# Patient Record
Sex: Male | Born: 1965 | Race: White | Hispanic: No | Marital: Married | Smoking: Former smoker
Health system: Southern US, Community
[De-identification: ages and names within clinical notes are randomized; demographics above are authoritative.]

## PROBLEM LIST (undated history)

## (undated) DIAGNOSIS — F419 Anxiety disorder, unspecified: Secondary | ICD-10-CM

## (undated) DIAGNOSIS — N529 Male erectile dysfunction, unspecified: Secondary | ICD-10-CM

## (undated) HISTORY — DX: Anxiety disorder, unspecified: F41.9

## (undated) HISTORY — DX: Male erectile dysfunction, unspecified: N52.9

---

## 2008-12-02 ENCOUNTER — Encounter: Admission: RE | Admit: 2008-12-02 | Discharge: 2008-12-02 | Payer: Self-pay | Admitting: Family Medicine

## 2009-12-26 ENCOUNTER — Encounter: Admission: RE | Admit: 2009-12-26 | Discharge: 2009-12-26 | Payer: Self-pay | Admitting: Otolaryngology

## 2011-08-05 ENCOUNTER — Ambulatory Visit (INDEPENDENT_AMBULATORY_CARE_PROVIDER_SITE_OTHER): Payer: BC Managed Care – PPO

## 2011-08-05 DIAGNOSIS — J060 Acute laryngopharyngitis: Secondary | ICD-10-CM

## 2011-12-03 ENCOUNTER — Other Ambulatory Visit: Payer: Self-pay | Admitting: Internal Medicine

## 2011-12-04 NOTE — Telephone Encounter (Signed)
Pt states he called in to pharmacy and did refill request for viagra and pharmacy states they did request twice no response , he wants to know if he can get another refill then call and make an appt. [phone]

## 2012-05-20 ENCOUNTER — Other Ambulatory Visit: Payer: Self-pay | Admitting: Internal Medicine

## 2012-06-02 ENCOUNTER — Ambulatory Visit (INDEPENDENT_AMBULATORY_CARE_PROVIDER_SITE_OTHER): Payer: BC Managed Care – PPO | Admitting: Physician Assistant

## 2012-06-02 VITALS — BP 134/70 | HR 84 | Temp 97.9°F | Resp 18 | Ht 76.0 in | Wt 212.0 lb

## 2012-06-02 DIAGNOSIS — J069 Acute upper respiratory infection, unspecified: Secondary | ICD-10-CM

## 2012-06-02 DIAGNOSIS — N529 Male erectile dysfunction, unspecified: Secondary | ICD-10-CM

## 2012-06-02 MED ORDER — IPRATROPIUM BROMIDE 0.06 % NA SOLN: 2.0000 | Freq: Three times a day (TID) | NASAL | Status: DC

## 2012-06-02 MED ORDER — GUAIFENESIN ER 1200 MG PO TB12: 1.0000 | ORAL_TABLET | Freq: Two times a day (BID) | ORAL | Status: DC

## 2012-06-02 MED ORDER — SILDENAFIL CITRATE 100 MG PO TABS: 100.0000 mg | ORAL_TABLET | ORAL | Status: DC | PRN

## 2012-06-02 NOTE — Progress Notes (Signed)
   [address], [place] [address]   Phone [phone]  Subjective:    Patient ID: Dakota Miller, male    DOB: 1966/03/07, 46 y.o.   MRN: [id number]  HPI  Pt presents to clinic with 5 day h/o cold symptoms.  Pt has congestion with yellow rhinorrhea and some PND with green sputum but it is rare.  He feels bad but he is using no OTC meds.  He has had no exposures. He is worried that he has a sinus infection.  No h/o seasonal allergies.  Also needs Viagra filled.  Review of Systems  Constitutional: Negative for fever and chills.  HENT: Positive for congestion (yellow rhinorrhea), rhinorrhea and postnasal drip. Negative for ear pain and sore throat.   Respiratory: Positive for cough (not really bothering him).   Gastrointestinal: Negative for nausea, vomiting and diarrhea.       Objective:   Physical Exam  Vitals reviewed. Constitutional: He is oriented to person, place, and time. He appears well-developed and well-nourished.  HENT:  Head: Normocephalic and atraumatic.  Right Ear: External ear normal.  Left Ear: External ear normal.  Eyes: Conjunctivae normal are normal.  Neck: Normal range of motion. Neck supple.  Cardiovascular: Normal rate, regular rhythm and normal heart sounds.   No murmur heard. Pulmonary/Chest: Effort normal and breath sounds normal.  Lymphadenopathy:    He has no cervical adenopathy.  Neurological: He is alert and oriented to person, place, and time.  Skin: Skin is warm and dry.  Psychiatric: He has a normal mood and affect. His behavior is normal. Judgment and thought content normal.          Assessment & Plan:   1. Erectile dysfunction  sildenafil (VIAGRA) 100 MG tablet  2. URI, acute  ipratropium (ATROVENT) 0.06 % nasal spray, Guaifenesin (MUCINEX MAXIMUM STRENGTH) 1200 MG TB12   Push fluids.  Tylenol/motrin prn.  Pt to call in 1 wk if sinus symptoms have not improved.

## 2013-01-15 ENCOUNTER — Ambulatory Visit: Payer: BC Managed Care – PPO

## 2013-01-15 ENCOUNTER — Ambulatory Visit (INDEPENDENT_AMBULATORY_CARE_PROVIDER_SITE_OTHER): Payer: BC Managed Care – PPO | Admitting: Emergency Medicine

## 2013-01-15 VITALS — BP 122/70 | HR 76 | Temp 98.2°F | Resp 18 | Ht 76.0 in | Wt 209.6 lb

## 2013-01-15 DIAGNOSIS — R1013 Epigastric pain: Secondary | ICD-10-CM

## 2013-01-15 DIAGNOSIS — R42 Dizziness and giddiness: Secondary | ICD-10-CM

## 2013-01-15 LAB — POCT CBC
HCT, POC: 49 % (ref 43.5–53.7)
Lymph, poc: 1.5 (ref 0.6–3.4)
MCH, POC: 30.2 pg (ref 27–31.2)
MCHC: 30.8 g/dL — AB (ref 31.8–35.4)
MID (cbc): 0.4 (ref 0–0.9)
MPV: 9.5 fL (ref 0–99.8)
Platelet Count, POC: 294 10*3/uL (ref 142–424)
RBC: 5 M/uL (ref 4.69–6.13)
RDW, POC: 13.1 %

## 2013-01-15 LAB — POCT URINALYSIS DIPSTICK
Bilirubin, UA: NEGATIVE
Blood, UA: NEGATIVE
Glucose, UA: NEGATIVE
Leukocytes, UA: NEGATIVE
Protein, UA: NEGATIVE
Urobilinogen, UA: 0.2

## 2013-01-15 NOTE — Patient Instructions (Addendum)

## 2013-01-15 NOTE — Progress Notes (Signed)
[facility] [address] [place] [address] [phone]  Date:  01/15/2013   Name:  Dakota Miller   DOB:  [birth year]   MRN:  [id number]  PCP:  Default, Provider, MD    Chief Complaint: Abdominal Pain, Nausea and Fatigue   History of Present Illness:  Dakota Miller is a 47 y.o. very pleasant male patient who presents with the following:  Ate out on Wednesday night and had sensation of abdominal bloating on Wednesday and moved his bowels.  Since has experienced persistent sense of bloating in the upper abdomen associated with abdominal pain and nausea.  No fever or chill.  Malaise, fatigue, sense of overall discomfort.  The patient has no complaint of blood, mucous, or pus in her stools. No stool change. No migration of pain.  No GU symptoms.  No food intolerance.  No excess caffeine, alcohol, or carbonated beverage.  No history of ulcer disease. Non smoker.  No surgical (abdominal) history .  Patient Active Problem List   Diagnosis Date Noted  . Erectile dysfunction 06/02/2012    Past Medical History  Diagnosis Date  . Erectile dysfunction     History reviewed. No pertinent past surgical history.  History  Substance Use Topics  . Smoking status: Former Games developer  . Smokeless tobacco: Not on file  . Alcohol Use: Yes    Family History  Problem Relation Age of Onset  . Heart disease Father   . Stroke Father   . Arthritis Mother     No Known Allergies  Medication list has been reviewed and updated.  Current Outpatient Prescriptions on File Prior to Visit  Medication Sig Dispense Refill  . sildenafil (VIAGRA) 100 MG tablet Take 1 tablet (100 mg total) by mouth as needed for erectile dysfunction.  4 tablet  5  . Guaifenesin (MUCINEX MAXIMUM STRENGTH) 1200 MG TB12 Take 1 tablet (1,200 mg total) by mouth 2 (two) times daily.  14 each  0  . ipratropium (ATROVENT) 0.06 % nasal spray Place 2 sprays into the nose 3 (three) times daily.  15 mL  0    No current facility-administered medications on file prior to visit.    Review of Systems:  As per HPI, otherwise negative.    Physical Examination: Filed Vitals:   01/15/13 1433  BP: 122/70  Pulse: 76  Temp: 98.2 F (36.8 C)  Resp: 18   Filed Vitals:   01/15/13 1433  Height: 6\' 4"  (1.93 m)  Weight: 209 lb 9.6 oz (95.074 kg)   Body mass index is 25.52 kg/(m^2). Ideal Body Weight: Weight in (lb) to have BMI = 25: 205  GEN: WDWN, NAD, Non-toxic, A & O x 3 HEENT: Atraumatic, Normocephalic. Neck supple. No masses, No LAD. Ears and Nose: No external deformity. CV: RRR, No M/G/R. No JVD. No thrill. No extra heart sounds. PULM: CTA B, no wheezes, crackles, rhonchi. No retractions. No resp. distress. No accessory muscle use. ABD: S, NT, ND, +BS. No rebound. No HSM. EXTR: No c/c/e NEURO Normal gait.  PSYCH: Normally interactive. Conversant. Not depressed or anxious appearing.  Calm demeanor.    Assessment and Plan: Abdominal pain Follow up as needed   Signed,  Phillips Odor, MD   Results for orders placed in visit on 01/15/13  POCT CBC      Result Value Range   WBC 8.4  4.6 - 10.2 K/uL   Lymph, poc 1.5  0.6 - 3.4   POC LYMPH  PERCENT 18.4  10 - 50 %L   MID (cbc) 0.4  0 - 0.9   POC MID % 4.9  0 - 12 %M   POC Granulocyte 6.4  2 - 6.9   Granulocyte percent 76.7  37 - 80 %G   RBC 5.00  4.69 - 6.13 M/uL   Hemoglobin 15.1  14.1 - 18.1 g/dL   HCT, POC 69.6  29.5 - 53.7 %   MCV 98.0 (*) 80 - 97 fL   MCH, POC 30.2  27 - 31.2 pg   MCHC 30.8 (*) 31.8 - 35.4 g/dL   RDW, POC 28.4     Platelet Count, POC 294  142 - 424 K/uL   MPV 9.5  0 - 99.8 fL  POCT URINALYSIS DIPSTICK      Result Value Range   Color, UA yellow     Clarity, UA clear     Glucose, UA neg     Bilirubin, UA neg     Ketones, UA neg     Spec Grav, UA 1.010     Blood, UA neg     pH, UA 7.0     Protein, UA neg     Urobilinogen, UA 0.2     Nitrite, UA neg     Leukocytes, UA Negative     [facility]  reading (PRIMARY) by  Dr. Dareen Piano.  No free air or obstruction.

## 2013-06-03 ENCOUNTER — Other Ambulatory Visit: Payer: Self-pay | Admitting: Physician Assistant

## 2013-06-07 ENCOUNTER — Other Ambulatory Visit: Payer: Self-pay | Admitting: Physician Assistant

## 2013-10-01 ENCOUNTER — Ambulatory Visit (INDEPENDENT_AMBULATORY_CARE_PROVIDER_SITE_OTHER): Payer: BC Managed Care – PPO | Admitting: Physician Assistant

## 2013-10-01 VITALS — BP 138/80 | HR 82 | Temp 98.0°F | Resp 16 | Ht 76.0 in | Wt 211.0 lb

## 2013-10-01 DIAGNOSIS — J069 Acute upper respiratory infection, unspecified: Secondary | ICD-10-CM

## 2013-10-01 DIAGNOSIS — N529 Male erectile dysfunction, unspecified: Secondary | ICD-10-CM

## 2013-10-01 DIAGNOSIS — B9789 Other viral agents as the cause of diseases classified elsewhere: Principal | ICD-10-CM

## 2013-10-01 MED ORDER — GUAIFENESIN ER 1200 MG PO TB12: 1.0000 | ORAL_TABLET | Freq: Two times a day (BID) | ORAL | Status: DC | PRN

## 2013-10-01 MED ORDER — SILDENAFIL CITRATE 100 MG PO TABS: 100.0000 mg | ORAL_TABLET | ORAL | Status: DC | PRN

## 2013-10-01 MED ORDER — HYDROCOD POLST-CHLORPHEN POLST 10-8 MG/5ML PO LQCR: 5.0000 mL | Freq: Two times a day (BID) | ORAL | Status: DC | PRN

## 2013-10-01 MED ORDER — IPRATROPIUM BROMIDE 0.03 % NA SOLN: 2.0000 | Freq: Two times a day (BID) | NASAL | Status: DC

## 2013-10-01 NOTE — Patient Instructions (Signed)
Get plenty of rest and drink at least 64 ounces of water daily. 

## 2013-10-01 NOTE — Progress Notes (Signed)
Subjective:    Patient ID: Dakota Miller, male    DOB: 05/22/1966, 48 y.o.   MRN: [id number]   PCP: Default, Provider, MD [facility])  Chief Complaint  Patient presents with  . Sinusitis    x 2 day   Medications, allergies, past medical history, surgical history, family history, social history and problem list reviewed and updated.  HPI  Recently returned from a work trip to [place]. Getting worse. "3 of the 4 of us came back sick." Feels like throat is swollen, eyes are burning, airways feel dry. Sometimes cough is productive.  Cough hurts in the throat, and in the upper chest. Feels feverish, but no fever. HA today. Motrin this am, which helped some, nothing since then (about 8:30 am).  He also requests a refill of Viagra.  Review of Systems No myalgias, arthralgias, rash, GI/GU symptoms.    Objective:   Physical Exam  Vitals reviewed. Constitutional: He is oriented to person, place, and time. Vital signs are normal. He appears well-developed and well-nourished. No distress.  HENT:  Head: Normocephalic and atraumatic.  Right Ear: Hearing, tympanic membrane, external ear and ear canal normal.  Left Ear: Hearing, tympanic membrane, external ear and ear canal normal.  Nose: Mucosal edema and rhinorrhea present.  No foreign bodies. Right sinus exhibits maxillary sinus tenderness and frontal sinus tenderness. Left sinus exhibits maxillary sinus tenderness and frontal sinus tenderness.  Mouth/Throat: Uvula is midline, oropharynx is clear and moist and mucous membranes are normal. No uvula swelling. No oropharyngeal exudate.  Eyes: Conjunctivae and EOM are normal. Pupils are equal, round, and reactive to light. Right eye exhibits no discharge. Left eye exhibits no discharge. No scleral icterus.  Neck: Trachea normal, normal range of motion and full passive range of motion without pain. Neck supple. No mass and no thyromegaly present.  Cardiovascular: Normal rate, regular rhythm and  normal heart sounds.   Pulmonary/Chest: Effort normal and breath sounds normal.  Lymphadenopathy:       Head (right side): No submandibular, no tonsillar, no preauricular, no posterior auricular and no occipital adenopathy present.       Head (left side): No submandibular, no tonsillar, no preauricular and no occipital adenopathy present.    He has no cervical adenopathy.       Right: No supraclavicular adenopathy present.       Left: No supraclavicular adenopathy present.  Neurological: He is alert and oriented to person, place, and time. He has normal strength. No cranial nerve deficit or sensory deficit.  Skin: Skin is warm, dry and intact. No rash noted.  Psychiatric: He has a normal mood and affect. His speech is normal and behavior is normal.          Assessment & Plan:  1. Viral URI with cough Supportive care.  Anticipatory guidance.  RTC if symptoms worsen/persist. - ipratropium (ATROVENT) 0.03 % nasal spray; Place 2 sprays into both nostrils 2 (two) times daily.  Dispense: 30 mL; Refill: 0 - Guaifenesin (MUCINEX MAXIMUM STRENGTH) 1200 MG TB12; Take 1 tablet (1,200 mg total) by mouth every 12 (twelve) hours as needed.  Dispense: 14 tablet; Refill: 1 - chlorpheniramine-HYDROcodone (TUSSIONEX PENNKINETIC ER) 10-8 MG/5ML LQCR; Take 5 mLs by mouth every 12 (twelve) hours as needed for cough (cough).  Dispense: 100 mL; Refill: 0  2. Erectile dysfunction - sildenafil (VIAGRA) 100 MG tablet; Take 1 tablet (100 mg total) by mouth as needed for erectile dysfunction.  Dispense: 4 tablet; Refill: 5   Tamsen Reist  S. Fatemah Pourciau, PA-C Physician Assistant-Certified [facility]  

## 2014-09-29 ENCOUNTER — Ambulatory Visit (INDEPENDENT_AMBULATORY_CARE_PROVIDER_SITE_OTHER): Payer: BLUE CROSS/BLUE SHIELD | Admitting: Physician Assistant

## 2014-09-29 VITALS — BP 124/80 | HR 84 | Temp 97.5°F | Resp 19 | Ht 76.5 in | Wt 223.8 lb

## 2014-09-29 DIAGNOSIS — J069 Acute upper respiratory infection, unspecified: Secondary | ICD-10-CM

## 2014-09-29 DIAGNOSIS — R0602 Shortness of breath: Secondary | ICD-10-CM

## 2014-09-29 DIAGNOSIS — R5383 Other fatigue: Secondary | ICD-10-CM

## 2014-09-29 DIAGNOSIS — B9789 Other viral agents as the cause of diseases classified elsewhere: Principal | ICD-10-CM

## 2014-09-29 DIAGNOSIS — K219 Gastro-esophageal reflux disease without esophagitis: Secondary | ICD-10-CM

## 2014-09-29 LAB — COMPREHENSIVE METABOLIC PANEL
ALBUMIN: 4.6 g/dL (ref 3.5–5.2)
ALT: 54 U/L — AB (ref 0–53)
AST: 26 U/L (ref 0–37)
Alkaline Phosphatase: 79 U/L (ref 39–117)
BILIRUBIN TOTAL: 0.7 mg/dL (ref 0.2–1.2)
BUN: 14 mg/dL (ref 6–23)
CALCIUM: 9.8 mg/dL (ref 8.4–10.5)
CHLORIDE: 98 meq/L (ref 96–112)
CO2: 30 meq/L (ref 19–32)
Creat: 0.82 mg/dL (ref 0.50–1.35)
GLUCOSE: 96 mg/dL (ref 70–99)
Potassium: 5.1 mEq/L (ref 3.5–5.3)
SODIUM: 135 meq/L (ref 135–145)
TOTAL PROTEIN: 7.5 g/dL (ref 6.0–8.3)

## 2014-09-29 LAB — CBC WITH DIFFERENTIAL/PLATELET
BASOS ABS: 0 10*3/uL (ref 0.0–0.1)
Basophils Relative: 0 % (ref 0–1)
Eosinophils Absolute: 0.2 10*3/uL (ref 0.0–0.7)
Eosinophils Relative: 2 % (ref 0–5)
HEMATOCRIT: 44.5 % (ref 39.0–52.0)
Hemoglobin: 15.5 g/dL (ref 13.0–17.0)
LYMPHS PCT: 10 % — AB (ref 12–46)
Lymphs Abs: 0.8 10*3/uL (ref 0.7–4.0)
MCH: 31.1 pg (ref 26.0–34.0)
MCHC: 34.8 g/dL (ref 30.0–36.0)
MCV: 89.4 fL (ref 78.0–100.0)
MONO ABS: 0.8 10*3/uL (ref 0.1–1.0)
MONOS PCT: 11 % (ref 3–12)
MPV: 10.1 fL (ref 8.6–12.4)
NEUTROS ABS: 5.8 10*3/uL (ref 1.7–7.7)
NEUTROS PCT: 77 % (ref 43–77)
PLATELETS: 271 10*3/uL (ref 150–400)
RBC: 4.98 MIL/uL (ref 4.22–5.81)
RDW: 13.2 % (ref 11.5–15.5)
WBC: 7.5 10*3/uL (ref 4.0–10.5)

## 2014-09-29 LAB — TSH: TSH: 0.542 u[IU]/mL (ref 0.350–4.500)

## 2014-09-29 MED ORDER — IPRATROPIUM BROMIDE 0.03 % NA SOLN: 2.0000 | Freq: Two times a day (BID) | NASAL | Status: DC

## 2014-09-29 MED ORDER — HYDROCOD POLST-CHLORPHEN POLST 10-8 MG/5ML PO LQCR: 5.0000 mL | Freq: Two times a day (BID) | ORAL | Status: DC | PRN

## 2014-09-29 MED ORDER — OMEPRAZOLE 20 MG PO CPDR: 20.0000 mg | DELAYED_RELEASE_CAPSULE | Freq: Every day | ORAL | Status: DC

## 2014-09-29 MED ORDER — BENZONATATE 100 MG PO CAPS: 100.0000 mg | ORAL_CAPSULE | Freq: Three times a day (TID) | ORAL | Status: DC | PRN

## 2014-09-29 NOTE — Progress Notes (Signed)
Subjective:    Patient ID: Dakota Miller, male    DOB: [birth year], 49 y.o.   MRN: [id number]  HPI  This is a 49 year old male with PMH ED who is presenting with sore throat, productive cough, eyes burning x 1 day. States his sinuses are "burning". Having slight nasal congestion. Has had chills but no fever. Denies SOB, wheezing, otalgia. He hasn't taken anything for his symptoms. He does not have a history of asthma. He is a former smoker, quit 7 years ago.  Pt is also reporting a sensation at the base of his neck x 2 months. Feels he needs to clear his throat. He does not have environmental allergies and does not feel he has post-nasal drip. Denies dysphagia. He is endorsing increasing heartburn in past couple months. He does not take anything for heartburn.  He is also endorsing increased fatigue x 6 months. He states he is active and eats healthy. He does not exercise but states "i'm never sitting still". He wakes in the morning feeling rejuvenated but experiences fatigue off and on throughout day. He has never been anemic. Has been told he snores by his wife but never been told he stops breathing. No problems with thyroid. He is endorsing new SOB with exercise over past 6 months. Notices it when he runs at his son's baseball practice. Dad had CAD. He denies CP, palpitations, LE edema, syncope.  Review of Systems  Constitutional: Positive for fatigue. Negative for fever and chills.  HENT: Positive for congestion and sore throat. Negative for ear pain and sinus pressure.   Eyes: Negative for redness.  Respiratory: Positive for cough and shortness of breath. Negative for wheezing.   Cardiovascular: Negative for chest pain, palpitations and leg swelling.  Gastrointestinal: Negative for nausea, vomiting and diarrhea.  Skin: Negative for rash.  Allergic/Immunologic: Negative for environmental allergies.  Neurological: Negative for syncope.  Hematological: Negative for adenopathy.     Patient Active Problem List   Diagnosis Date Noted  . Erectile dysfunction 06/02/2012   Prior to Admission medications   Medication Sig Start Date End Date Taking? Authorizing Provider  sildenafil (VIAGRA) 100 MG tablet Take 1 tablet (100 mg total) by mouth as needed for erectile dysfunction. 10/01/13  Yes Chelle S Jeffery, PA-C   No Known Allergies  Patient's social and family history were reviewed.     Objective:   Physical Exam  Constitutional: He is oriented to person, place, and time. He appears well-developed and well-nourished. No distress.  HENT:  Head: Normocephalic and atraumatic.  Right Ear: Hearing, tympanic membrane, external ear and ear canal normal.  Left Ear: Hearing, tympanic membrane, external ear and ear canal normal.  Nose: Nose normal. Right sinus exhibits no maxillary sinus tenderness and no frontal sinus tenderness. Left sinus exhibits no maxillary sinus tenderness and no frontal sinus tenderness.  Mouth/Throat: Uvula is midline and mucous membranes are normal. Posterior oropharyngeal erythema present. No oropharyngeal exudate or posterior oropharyngeal edema.  Eyes: Conjunctivae and lids are normal. Right eye exhibits no discharge. Left eye exhibits no discharge. No scleral icterus.  Cardiovascular: Normal rate, regular rhythm, normal heart sounds, intact distal pulses and normal pulses.   No murmur heard. Pulmonary/Chest: Effort normal and breath sounds normal. No respiratory distress. He has no wheezes. He has no rhonchi. He has no rales.  Musculoskeletal: Normal range of motion.  Lymphadenopathy:       Head (right side): No submental, no submandibular and no tonsillar adenopathy present.  Head (left side): No submental, no submandibular and no tonsillar adenopathy present.    He has no cervical adenopathy.  Neurological: He is alert and oriented to person, place, and time.  Skin: Skin is warm, dry and intact. No lesion and no rash noted.   Psychiatric: He has a normal mood and affect. His speech is normal and behavior is normal. Thought content normal.   BP 124/80 mmHg  Pulse 84  Temp(Src) 97.5 F (36.4 C) (Oral)  Resp 19  Ht 6' 4.5" (1.943 m)  Wt 223 lb 12.8 oz (101.515 kg)  BMI 26.89 kg/m2  SpO2 98%  EKG interpreted by Dr. Clelia CroftShaw: NSR, no ischemic changes.    Assessment & Plan:  1. Other fatigue Labs below pending. - CBC with Differential/Platelet - TSH - Comprehensive metabolic panel  2. Shortness of breath EKG NSR without ischemic changes. SOB is likely d/t deconditioning. Advised he start doing cardio 30 minutes a day 3-4 times a week and see if his SOB improves. He will return if continued or worsening SOB in 1-2 months. Return fasting for lipid panel. - EKG 12-Lead  3. Viral URI with cough Symptoms are likely d/t viral illness. Focus is on supportive care, see meds prescribed below. - ipratropium (ATROVENT) 0.03 % nasal spray; Place 2 sprays into both nostrils 2 (two) times daily.  Dispense: 30 mL; Refill: 0 - chlorpheniramine-HYDROcodone (TUSSIONEX PENNKINETIC ER) 10-8 MG/5ML LQCR; Take 5 mLs by mouth every 12 (twelve) hours as needed for cough (cough).  Dispense: 80 mL; Refill: 0 - benzonatate (TESSALON) 100 MG capsule; Take 1-2 capsules (100-200 mg total) by mouth 3 (three) times daily as needed for cough.  Dispense: 40 capsule; Refill: 0  4. Gastroesophageal reflux disease, esophagitis presence not specified Sensation of needing to clear throat likely d/t GERD. No post-nasal drip. He is not currently taking anything for GERD sx. He will start taking prilosec 20 mg every morning and see if this helps. Will return in 1 month if still having problems. - omeprazole (PRILOSEC) 20 MG capsule; Take 1 capsule (20 mg total) by mouth daily.  Dispense: 30 capsule; Refill: 3   Axton Cihlar V. Dyke BrackettBush, PA-C, MHS [facility]  09/29/2014

## 2014-09-29 NOTE — Patient Instructions (Signed)
Increase cardio to 30 mins a day 3-4 times a week. If you continue to have SOB in 1-2 months, return for evaluation. Start taking prilosec each morning and see if this helps that sensation in your throat. If not getting better in 1 month, return for further evaluation. Will call you with results of your lab tests. For your upper respiratory infection: use nasal spray twice a day, use tessalon during the day and use cough syrup at night for sleep. Return in 7-10 days if not getting better.

## 2014-11-23 IMAGING — CR DG ABDOMEN ACUTE W/ 1V CHEST
3 series · 3 of 3 positions shown · non-contrast
Comparison: None.

CLINICAL DATA: Abdominal pain

ACUTE ABDOMEN SERIES (ABDOMEN 2 VIEW & CHEST 1 VIEW)

[PA]
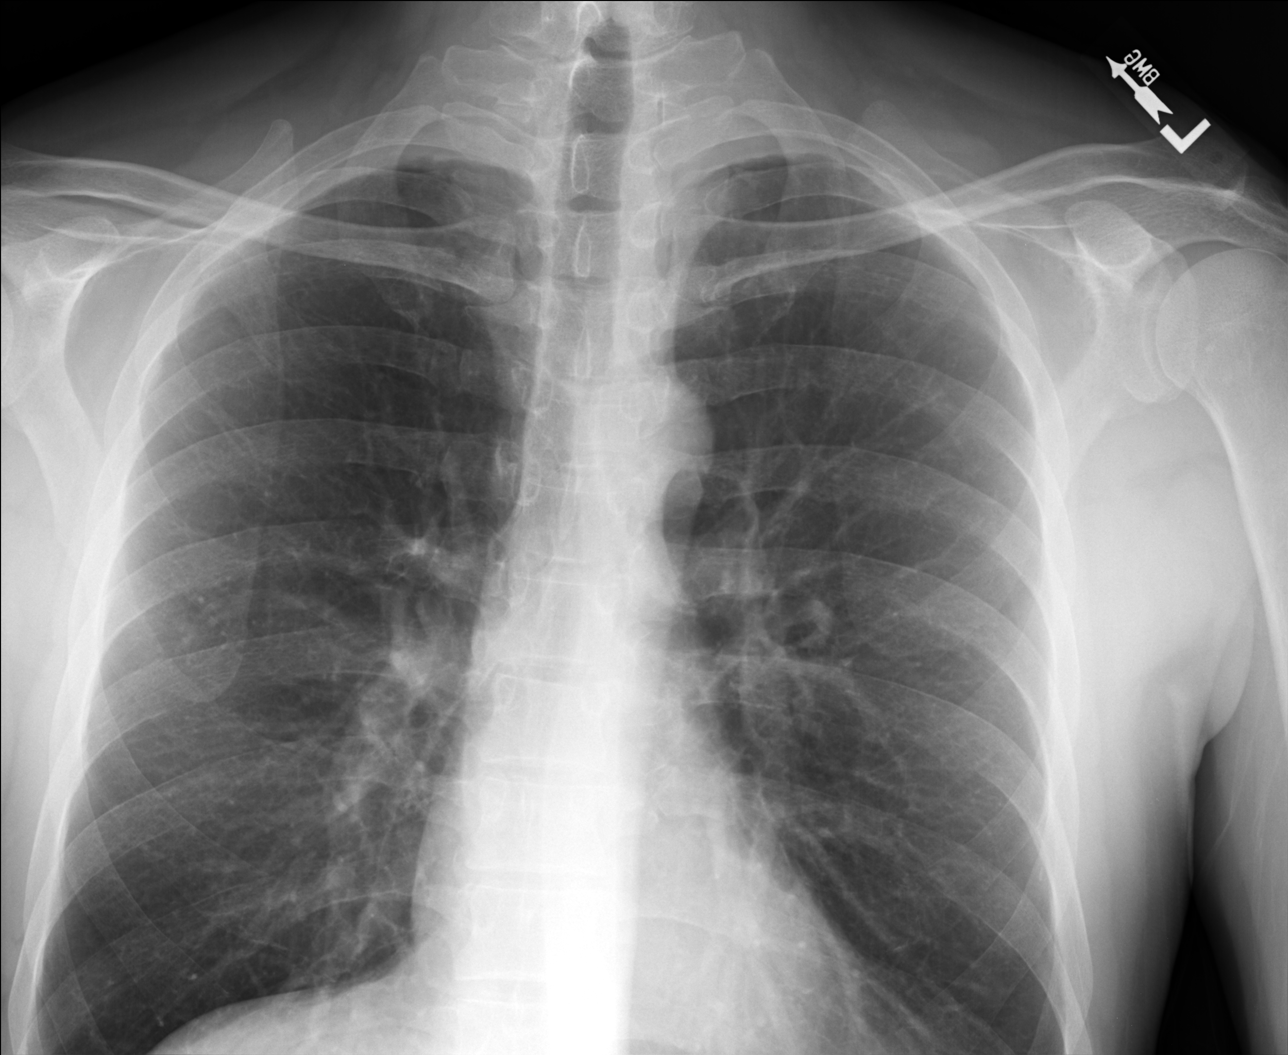

[AP (1 of 2)]
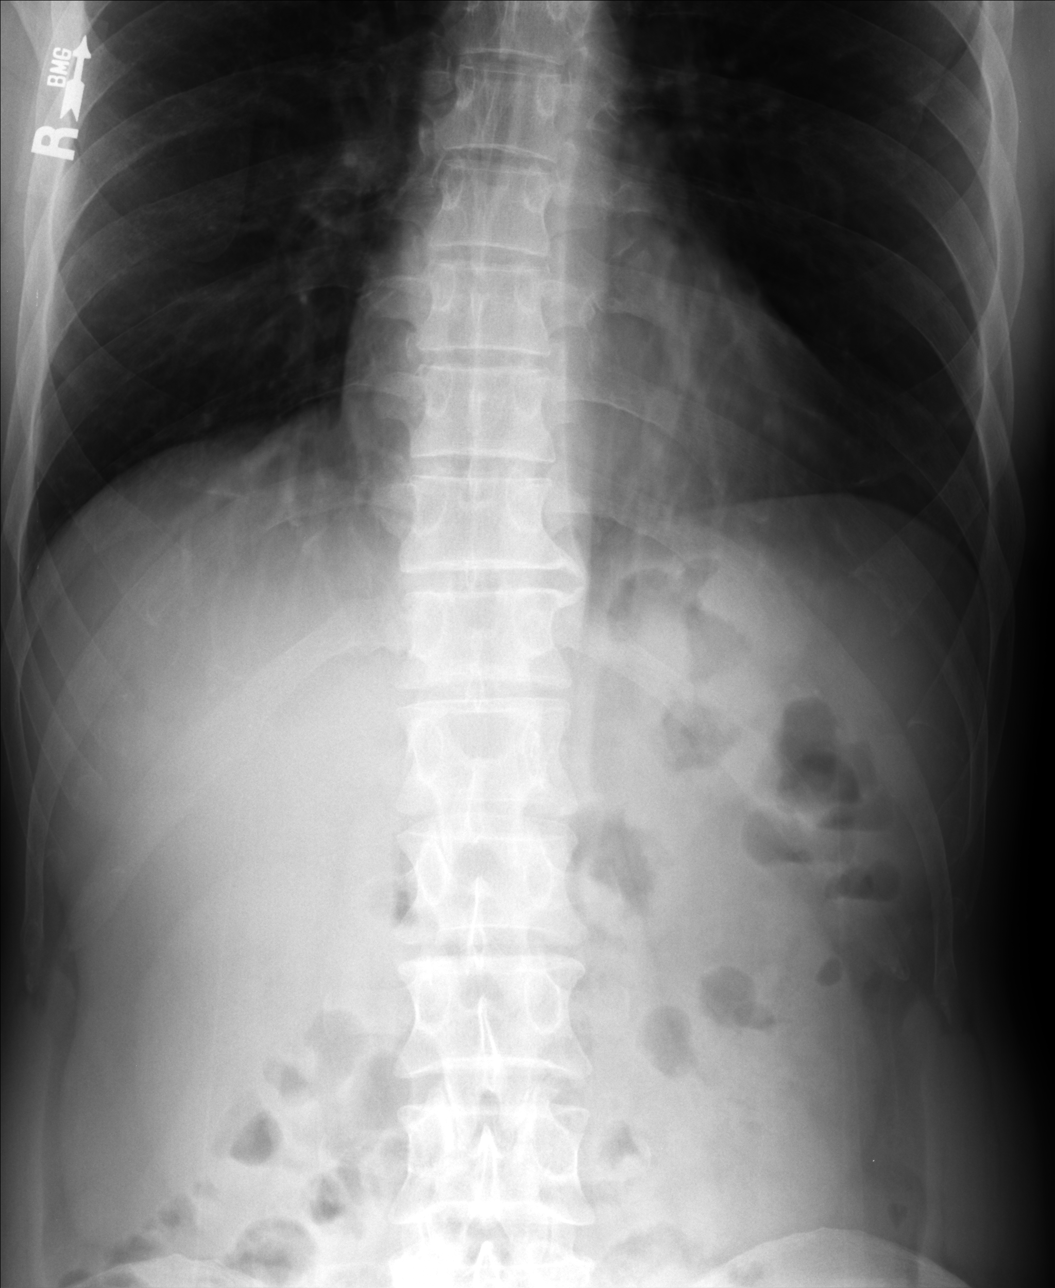

[AP (2 of 2)]
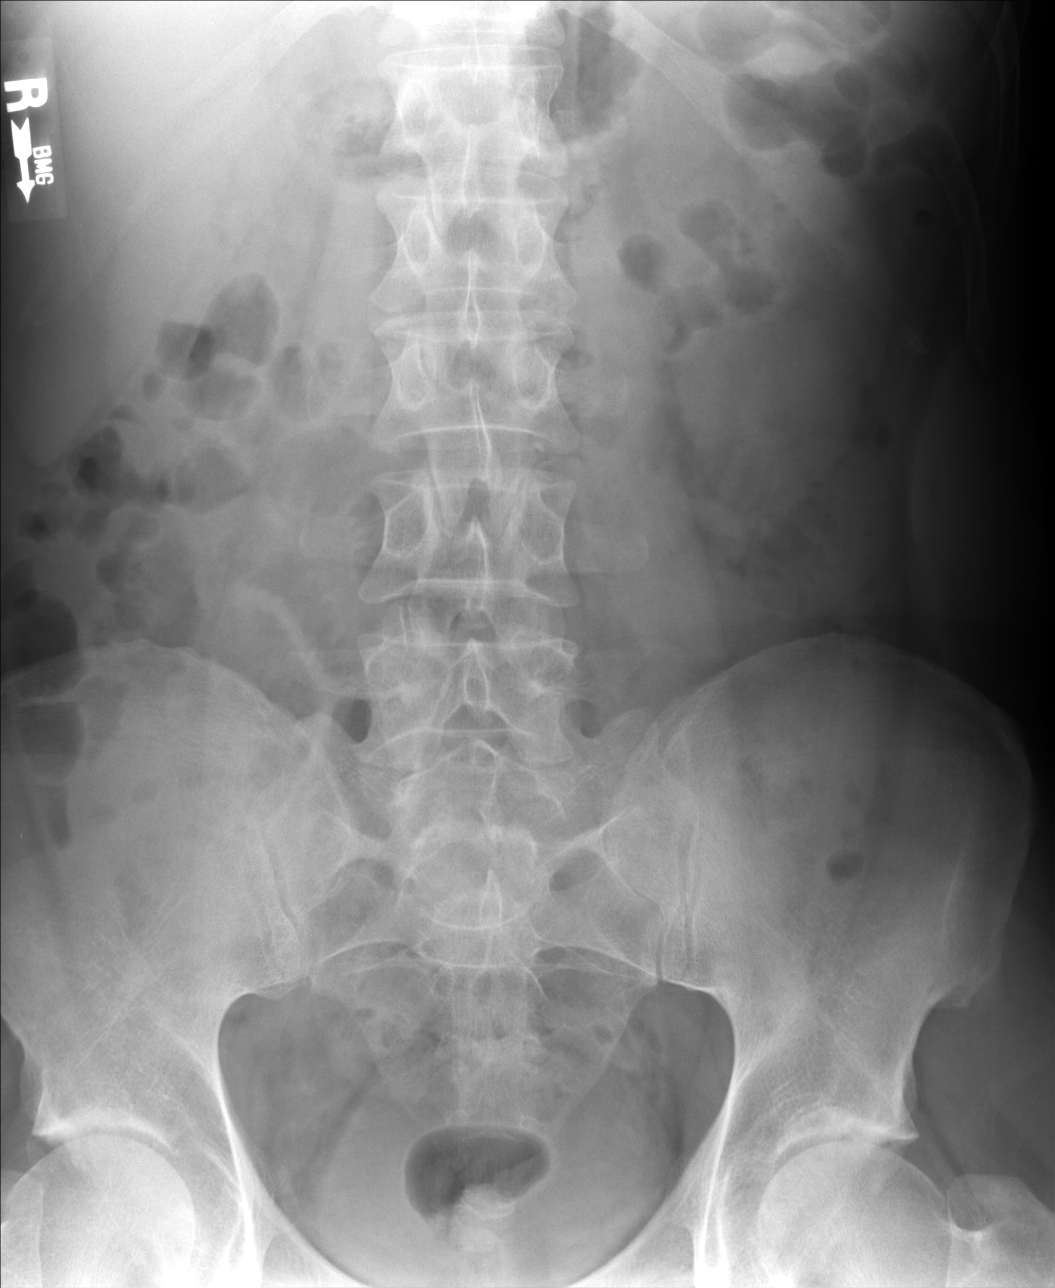

[3 of 3 positions shown; findings below may reference images not displayed]

FINDINGS: Lungs are clear. No pleural effusion or pneumothorax.

The heart is top normal in size.

Nonobstructive bowel gas pattern.

No evidence of free air under the diaphragm on the upright view.

Visualized osseous structures are within normal limits.
IMPRESSION: No evidence of acute cardiopulmonary disease.

No evidence of small bowel obstruction or free air.

Clinically significant discrepancy from primary report, if
provided: None

## 2015-08-31 ENCOUNTER — Ambulatory Visit (INDEPENDENT_AMBULATORY_CARE_PROVIDER_SITE_OTHER): Payer: 59 | Admitting: Emergency Medicine

## 2015-08-31 VITALS — BP 108/70 | HR 77 | Temp 98.7°F | Resp 18 | Ht 76.5 in | Wt 229.0 lb

## 2015-08-31 DIAGNOSIS — J0140 Acute pansinusitis, unspecified: Secondary | ICD-10-CM

## 2015-08-31 MED ORDER — PSEUDOEPHEDRINE-GUAIFENESIN ER 60-600 MG PO TB12: 1.0000 | ORAL_TABLET | Freq: Two times a day (BID) | ORAL | Status: DC

## 2015-08-31 MED ORDER — AMOXICILLIN-POT CLAVULANATE 875-125 MG PO TABS: 1.0000 | ORAL_TABLET | Freq: Two times a day (BID) | ORAL | Status: DC

## 2015-08-31 NOTE — Patient Instructions (Signed)

## 2015-08-31 NOTE — Progress Notes (Signed)
Subjective:  Patient ID: Dakota Miller, male    DOB: February 19, 1966  Age: 50 y.o. MRN: [id number]  CC: Sinusitis; Headache; and Sore Throat   HPI DAX MURGUIA presents   With nasal congestion postnasal drainage and a purulent nasal drainage. He has no cough wheezing or shortness of breath. Has no sore throat he has no fever or chills. Has no tool change. No rash. No improvement with over-the-counter medication  History Yojan has a past medical history of Erectile dysfunction.   He has no past surgical history on file.   His  family history includes Arthritis in his mother; Cancer in his father; Heart disease in his father; Stroke in his father.  He   reports that he has quit smoking. He has never used smokeless tobacco. He reports that he drinks about 7.0 oz of alcohol per week. He reports that he does not use illicit drugs.  Outpatient Prescriptions Prior to Visit  Medication Sig Dispense Refill  . sildenafil (VIAGRA) 100 MG tablet Take 1 tablet (100 mg total) by mouth as needed for erectile dysfunction. 4 tablet 5  . benzonatate (TESSALON) 100 MG capsule Take 1-2 capsules (100-200 mg total) by mouth 3 (three) times daily as needed for cough. (Patient not taking: Reported on 08/31/2015) 40 capsule 0  . chlorpheniramine-HYDROcodone (TUSSIONEX PENNKINETIC ER) 10-8 MG/5ML LQCR Take 5 mLs by mouth every 12 (twelve) hours as needed for cough (cough). (Patient not taking: Reported on 08/31/2015) 80 mL 0  . ipratropium (ATROVENT) 0.03 % nasal spray Place 2 sprays into both nostrils 2 (two) times daily. (Patient not taking: Reported on 08/31/2015) 30 mL 0  . omeprazole (PRILOSEC) 20 MG capsule Take 1 capsule (20 mg total) by mouth daily. (Patient not taking: Reported on 08/31/2015) 30 capsule 3   No facility-administered medications prior to visit.    Social History   Social History  . Marital Status: Married    Spouse Name: Marcelino Duster  . Number of Children: 3  . Years of Education:  N/A   Occupational History  . 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 Smoking status: Former Games developer  . Smokeless tobacco: Never Used  . Alcohol Use: 7.0 oz/week    14 drink(s) per week  . Drug Use: No  . Sexual Activity: Not Asked   Other Topics Concern  . None   Social History Narrative   Lives with his wife and their 3 children.     Review of Systems  Constitutional: Negative for fever, chills and appetite change.  HENT: Positive for congestion, postnasal drip, sinus pressure and sore throat. Negative for ear pain.   Eyes: Negative for pain and redness.  Respiratory: Negative for cough, shortness of breath and wheezing.   Cardiovascular: Negative for leg swelling.  Gastrointestinal: Negative for nausea, vomiting, abdominal pain, diarrhea, constipation and blood in stool.  Endocrine: Negative for polyuria.  Genitourinary: Negative for dysuria, urgency, frequency and flank pain.  Musculoskeletal: Negative for gait problem.  Skin: Negative for rash.  Neurological: Positive for headaches. Negative for weakness.  Psychiatric/Behavioral: Negative for confusion and decreased concentration. The patient is not nervous/anxious.     Objective:  BP 108/70 mmHg  Pulse 77  Temp(Src) 98.7 F (37.1 C) (Oral)  Resp 18  Ht 6' 4.5" (1.943 m)  Wt 229 lb (103.874 kg)  BMI 27.51 kg/m2  SpO2 97%  Physical Exam  Constitutional: He is oriented to person,  place, and time. He appears well-developed and well-nourished.  HENT:  Head: Normocephalic and atraumatic.  Eyes: Conjunctivae are normal. Pupils are equal, round, and reactive to light.  Pulmonary/Chest: Effort normal.  Musculoskeletal: He exhibits no edema.  Neurological: He is alert and oriented to person, place, and time.  Skin: Skin is dry.  Psychiatric: He has a normal mood and affect. His behavior is normal. Thought content normal.      Assessment & Plan:   Rahul was seen today for  sinusitis, headache and sore throat.  Diagnoses and all orders for this visit:  Acute pansinusitis, recurrence not specified  Other orders -     pseudoephedrine-guaifenesin (MUCINEX D) 60-600 MG 12 hr tablet; Take 1 tablet by mouth every 12 (twelve) hours. -     amoxicillin-clavulanate (AUGMENTIN) 875-125 MG tablet; Take 1 tablet by mouth 2 (two) times daily.  I am having Mr. Verret start on pseudoephedrine-guaifenesin and amoxicillin-clavulanate. I am also having him maintain his sildenafil, ipratropium, chlorpheniramine-HYDROcodone, benzonatate, and omeprazole.  Meds ordered this encounter  Medications  . pseudoephedrine-guaifenesin (MUCINEX D) 60-600 MG 12 hr tablet    Sig: Take 1 tablet by mouth every 12 (twelve) hours.    Dispense:  18 tablet    Refill:  0  . amoxicillin-clavulanate (AUGMENTIN) 875-125 MG tablet    Sig: Take 1 tablet by mouth 2 (two) times daily.    Dispense:  20 tablet    Refill:  0    Appropriate red flag conditions were discussed with the patient as well as actions that should be taken.  Patient expressed his understanding.  Follow-up: Return if symptoms worsen or fail to improve.  Carmelina Dane, MD

## 2015-10-06 ENCOUNTER — Ambulatory Visit (INDEPENDENT_AMBULATORY_CARE_PROVIDER_SITE_OTHER): Payer: 59 | Admitting: Family Medicine

## 2015-10-06 VITALS — BP 136/76 | HR 78 | Temp 97.7°F | Resp 18 | Ht 77.0 in | Wt 229.0 lb

## 2015-10-06 DIAGNOSIS — R6889 Other general symptoms and signs: Secondary | ICD-10-CM | POA: Diagnosis not present

## 2015-10-06 DIAGNOSIS — R5383 Other fatigue: Secondary | ICD-10-CM | POA: Diagnosis not present

## 2015-10-06 DIAGNOSIS — Z20828 Contact with and (suspected) exposure to other viral communicable diseases: Secondary | ICD-10-CM

## 2015-10-06 LAB — POCT INFLUENZA A/B
Influenza A, POC: NEGATIVE
Influenza B, POC: NEGATIVE

## 2015-10-06 LAB — POCT CBC
Granulocyte percent: 63.8 % (ref 37–80)
HCT, POC: 44.7 % (ref 43.5–53.7)
Hemoglobin: 15.7 g/dL (ref 14.1–18.1)
Lymph, poc: 2.1 (ref 0.6–3.4)
MCH, POC: 31.8 pg — AB (ref 27–31.2)
MCHC: 35.1 g/dL (ref 31.8–35.4)
MCV: 90.5 fL (ref 80–97)
MID (cbc): 0.8 (ref 0–0.9)
MPV: 7.9 fL (ref 0–99.8)
POC Granulocyte: 5.1 (ref 2–6.9)
POC LYMPH PERCENT: 26.4 %L (ref 10–50)
POC MID %: 9.8 % (ref 0–12)
Platelet Count, POC: 260 10*3/uL (ref 142–424)
RBC: 4.94 M/uL (ref 4.69–6.13)
RDW, POC: 13.1 %
WBC: 8 10*3/uL (ref 4.6–10.2)

## 2015-10-06 LAB — TSH: TSH: 0.63 mIU/L (ref 0.40–4.50)

## 2015-10-06 MED ORDER — OSELTAMIVIR PHOSPHATE 75 MG PO CAPS: 75.0000 mg | ORAL_CAPSULE | Freq: Every day | ORAL | Status: DC

## 2015-10-06 NOTE — Patient Instructions (Signed)
Influenza, Adult Influenza ("the flu") is a viral infection of the respiratory tract. It occurs more often in winter months because people spend more time in close contact with one another. Influenza can make you feel very sick. Influenza easily spreads from person to person (contagious). CAUSES  Influenza is caused by a virus that infects the respiratory tract. You can catch the virus by breathing in droplets from an infected person's cough or sneeze. You can also catch the virus by touching something that was recently contaminated with the virus and then touching your mouth, nose, or eyes. RISKS AND COMPLICATIONS You may be at risk for a more severe case of influenza if you smoke cigarettes, have diabetes, have chronic heart disease (such as heart failure) or lung disease (such as asthma), or if you have a weakened immune system. Elderly people and pregnant women are also at risk for more serious infections. The most common problem of influenza is a lung infection (pneumonia). Sometimes, this problem can require emergency medical care and may be life threatening. SIGNS AND SYMPTOMS  Symptoms typically last 4 to 10 days and may include:  Fever.  Chills.  Headache, body aches, and muscle aches.  Sore throat.  Chest discomfort and cough.  Poor appetite.  Weakness or feeling tired.  Dizziness.  Nausea or vomiting. DIAGNOSIS  Diagnosis of influenza is often made based on your history and a physical exam. A nose or throat swab test can be done to confirm the diagnosis. TREATMENT  In mild cases, influenza goes away on its own. Treatment is directed at relieving symptoms. For more severe cases, your health care provider may prescribe antiviral medicines to shorten the sickness. Antibiotic medicines are not effective because the infection is caused by a virus, not by bacteria. HOME CARE INSTRUCTIONS  Take medicines only as directed by your health care provider.  Use a cool mist humidifier  to make breathing easier.  Get plenty of rest until your temperature returns to normal. This usually takes 3 to 4 days.  Drink enough fluid to keep your urine clear or pale yellow.  Cover yourmouth and nosewhen coughing or sneezing,and wash your handswellto prevent thevirusfrom spreading.  Stay homefromwork orschool untilthe fever is gonefor at least 1full day. PREVENTION  An annual influenza vaccination (flu shot) is the best way to avoid getting influenza. An annual flu shot is now routinely recommended for all adults in the [place] SEEK MEDICAL CARE IF:  You experiencechest pain, yourcough worsens,or you producemore mucus.  Youhave nausea,vomiting, ordiarrhea.  Your fever returns or gets worse. SEEK IMMEDIATE MEDICAL CARE IF:  You havetrouble breathing, you become short of breath,or your skin ornails becomebluish.  You have severe painor stiffnessin the neck.  You develop a sudden headache, or pain in the face or ear.  You have nausea or vomiting that you cannot control. MAKE SURE YOU:   Understand these instructions.  Will watch your condition.  Will get help right away if you are not doing well or get worse.   This information is not intended to replace advice given to you by your health care provider. Make sure you discuss any questions you have with your health care provider.   Document Released: 07/26/2000 Document Revised: 08/19/2014 Document Reviewed: 10/28/2011 Elsevier Interactive Patient Education 2016 Elsevier Inc.   

## 2015-10-06 NOTE — Progress Notes (Signed)
 Chief Complaint:  Chief Complaint  Patient presents with  . Headache    started last night  . Shortness of Breath  . Fatigue    HPI: Dakota Miller is a 50 y.o. male who reports to [facility] today complaining of  Fatigue, headache,  No nausea, vomiting, abd pain, or diarrhea HE was traveling over the long weekend, his family sick with flu and the guys he was rooming had the flu.  He denies any thryoid issues.    Past Medical History  Diagnosis Date  . Erectile dysfunction    History reviewed. No pertinent past surgical history. Social History   Social History  . Marital Status: Married    Spouse Name: Marcelino Duster  . Number of Children: 3  . Years of Education: N/A   Occupational History  . 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 Smoking status: Former Games developer  . Smokeless tobacco: Never Used  . Alcohol Use: 7.0 oz/week    14 drink(s) per week  . Drug Use: No  . Sexual Activity: Not Asked   Other Topics Concern  . None   Social History Narrative   Lives with his wife and their 3 children.   Family History  Problem Relation Age of Onset  . Heart disease Father   . Stroke Father   . Cancer Father     esophageal  . Arthritis Mother    No Known Allergies Prior to Admission medications   Medication Sig Start Date End Date Taking? Authorizing Provider  amoxicillin-clavulanate (AUGMENTIN) 875-125 MG tablet Take 1 tablet by mouth 2 (two) times daily. Patient not taking: Reported on 10/06/2015 08/31/15   Carmelina Dane, MD  benzonatate (TESSALON) 100 MG capsule Take 1-2 capsules (100-200 mg total) by mouth 3 (three) times daily as needed for cough. Patient not taking: Reported on 08/31/2015 09/29/14   Dorna Leitz, PA-C  chlorpheniramine-HYDROcodone [facility] PENNKINETIC ER) 10-8 MG/5ML LQCR Take 5 mLs by mouth every 12 (twelve) hours as needed for cough (cough). Patient not taking: Reported on 08/31/2015 09/29/14   Lanier Clam  V, PA-C  ipratropium (ATROVENT) 0.03 % nasal spray Place 2 sprays into both nostrils 2 (two) times daily. Patient not taking: Reported on 08/31/2015 09/29/14   Dorna Leitz, PA-C  omeprazole (PRILOSEC) 20 MG capsule Take 1 capsule (20 mg total) by mouth daily. Patient not taking: Reported on 08/31/2015 09/29/14   Dorna Leitz, PA-C  pseudoephedrine-guaifenesin [facility] D) 60-600 MG 12 hr tablet Take 1 tablet by mouth every 12 (twelve) hours. Patient not taking: Reported on 10/06/2015 08/31/15 08/30/16  Carmelina Dane, MD  sildenafil (VIAGRA) 100 MG tablet Take 1 tablet (100 mg total) by mouth as needed for erectile dysfunction. Patient not taking: Reported on 10/06/2015 10/01/13   Porfirio Oar, PA-C     ROS: The patient denies fevers, chills, night sweats, unintentional weight loss, chest pain, palpitations, wheezing, dyspnea on exertion, nausea, vomiting, abdominal pain, dysuria, hematuria, melena, numbness, or tingling.   All other systems have been reviewed and were otherwise negative with the exception of those mentioned in the HPI and as above.    PHYSICAL EXAM: Filed Vitals:   10/06/15 1346  BP: 136/76  Pulse: 78  Temp: 97.7 F (36.5 C)  Resp: 18   Body mass index is 27.15 kg/(m^2).   General: Alert, no acute distress HEENT:  Normocephalic, atraumatic, oropharynx patent. EOMI, PERRLA +  questionable enlarged right thyroid vs normal variant of SCM  Cardiovascular:  Regular rate and rhythm, no rubs murmurs or gallops.  No Carotid bruits, radial pulse intact. No pedal edema.  Respiratory: Clear to auscultation bilaterally.  No wheezes, rales, or rhonchi.  No cyanosis, no use of accessory musculature Abdominal: No organomegaly, abdomen is soft and non-tender, positive bowel sounds. No masses. Skin: No rashes. Neurologic: Facial musculature symmetric. Psychiatric: Patient acts appropriately throughout our interaction. Lymphatic: No cervical or submandibular  lymphadenopathy Musculoskeletal: Gait intact. No edema, tenderness   LABS: Results for orders placed or performed in visit on 10/06/15  POCT CBC  Result Value Ref Range   WBC 8.0 4.6 - 10.2 K/uL   Lymph, poc 2.1 0.6 - 3.4   POC LYMPH PERCENT 26.4 10 - 50 %L   MID (cbc) 0.8 0 - 0.9   POC MID % 9.8 0 - 12 %M   POC Granulocyte 5.1 2 - 6.9   Granulocyte percent 63.8 37 - 80 %G   RBC 4.94 4.69 - 6.13 M/uL   Hemoglobin 15.7 14.1 - 18.1 g/dL   HCT, POC 04.5 40.9 - 53.7 %   MCV 90.5 80 - 97 fL   MCH, POC 31.8 (A) 27 - 31.2 pg   MCHC 35.1 31.8 - 35.4 g/dL   RDW, POC 81.1 %   Platelet Count, POC 260 142 - 424 K/uL   MPV 7.9 0 - 99.8 fL  POCT Influenza A/B  Result Value Ref Range   Influenza A, POC Negative Negative   Influenza B, POC Negative Negative     EKG/XRAY:   Primary read interpreted by Dr. Conley Rolls at [facility].   ASSESSMENT/PLAN: Encounter Diagnoses  Name Primary?  . Other fatigue   . Flu-like symptoms Yes  . Exposure to the flu    Will go ahead and ppx with tamiflu due to exposure.  TSH pending FU prn   Gross sideeffects, risk and benefits, and alternatives of medications d/w patient. Patient is aware that all medications have potential sideeffects and we are unable to predict every sideeffect or drug-drug interaction that may occur.    DO  10/06/2015 3:25 PM

## 2015-10-07 ENCOUNTER — Encounter: Payer: Self-pay | Admitting: *Deleted

## 2015-11-09 ENCOUNTER — Ambulatory Visit (INDEPENDENT_AMBULATORY_CARE_PROVIDER_SITE_OTHER): Payer: 59 | Admitting: Family Medicine

## 2015-11-09 VITALS — BP 122/86 | HR 86 | Temp 98.1°F | Resp 18 | Ht 77.0 in | Wt 233.0 lb

## 2015-11-09 DIAGNOSIS — J029 Acute pharyngitis, unspecified: Secondary | ICD-10-CM

## 2015-11-09 DIAGNOSIS — N529 Male erectile dysfunction, unspecified: Secondary | ICD-10-CM

## 2015-11-09 MED ORDER — CEFDINIR 300 MG PO CAPS: 600.0000 mg | ORAL_CAPSULE | Freq: Every day | ORAL | Status: DC

## 2015-11-09 MED ORDER — OSELTAMIVIR PHOSPHATE 75 MG PO CAPS: 75.0000 mg | ORAL_CAPSULE | Freq: Every day | ORAL | Status: DC

## 2015-11-09 MED ORDER — SILDENAFIL CITRATE 100 MG PO TABS: 100.0000 mg | ORAL_TABLET | ORAL | Status: DC | PRN

## 2015-11-09 NOTE — Patient Instructions (Addendum)
I'll let you know in 2 days about the throat culture

## 2015-11-09 NOTE — Progress Notes (Signed)
   Subjective:    Patient ID: Dakota Miller, male    DOB: 06/25/1966, 50 y.o.   MRN: [id number] By signing my name below, I, Dakota Miller, attest that this documentation has been prepared under the direction and in the presence of Elvina SidleKurt Caitlin Hillmer, MD.  Electronically Signed: Littie Deedsichard Miller, Medical Scribe. 11/09/2015. 9:34 AM.  HPI HPI Comments: Dakota Miller is a 50 y.o. male who presents to the [facility] complaining of gradual onset sore throat that started yesterday but worsened this morning. Patient reports having associated productive cough of yellow sputum and general malaise. He denies fever and rhinorrhea. His wife, who I recently saw, was recently diagnosed with the flu 3 days ago. She recently began having what sounds like episodes of sleep apnea at night.  Patient is also requesting a refill of Viagra.   Patient owns [employer]  Review of Systems  Constitutional: Negative for fever.  HENT: Positive for sore throat. Negative for rhinorrhea.   Respiratory: Positive for cough.        Objective:   Physical Exam CONSTITUTIONAL: Well developed/well nourished HEAD: Normocephalic/atraumatic EYES: EOM/PERRL ENMT: Mucous membranes moist. Throat is very red. NECK: supple no meningeal signs SPINE: entire spine nontender CV: S1/S2 noted, no murmurs/rubs/gallops noted LUNGS: Lungs are clear to auscultation bilaterally, no apparent distress ABDOMEN: soft, nontender, no rebound or guarding GU: no cva tenderness NEURO: Pt is awake/alert, moves all extremitiesx4 EXTREMITIES: pulses normal, full ROM SKIN: warm, color normal PSYCH: no abnormalities of mood noted        Assessment & Plan:   This chart was scribed in my presence and reviewed by me personally.    ICD-9-CM ICD-10-CM   1. Sore throat 462 J02.9 cefdinir (OMNICEF) 300 MG capsule     Culture, Group A Strep  2. Erectile dysfunction, unspecified erectile dysfunction type 607.84 N52.9 sildenafil  (VIAGRA) 100 MG tablet     Signed, Elvina SidleKurt Leelyn Jasinski, MD

## 2015-11-11 LAB — CULTURE, GROUP A STREP: Organism ID, Bacteria: NORMAL

## 2016-03-08 ENCOUNTER — Ambulatory Visit (INDEPENDENT_AMBULATORY_CARE_PROVIDER_SITE_OTHER): Payer: 59 | Admitting: Physician Assistant

## 2016-03-08 VITALS — BP 124/80 | HR 103 | Temp 99.9°F | Resp 18 | Ht 76.0 in | Wt 224.8 lb

## 2016-03-08 DIAGNOSIS — J069 Acute upper respiratory infection, unspecified: Secondary | ICD-10-CM

## 2016-03-08 DIAGNOSIS — N529 Male erectile dysfunction, unspecified: Secondary | ICD-10-CM | POA: Diagnosis not present

## 2016-03-08 MED ORDER — HYDROCODONE-HOMATROPINE 5-1.5 MG/5ML PO SYRP: 5.0000 mL | ORAL_SOLUTION | Freq: Four times a day (QID) | ORAL | Status: DC | PRN

## 2016-03-08 MED ORDER — HYDROCODONE-HOMATROPINE 5-1.5 MG/5ML PO SYRP: 5.0000 mL | ORAL_SOLUTION | Freq: Three times a day (TID) | ORAL | 0 refills | Status: DC | PRN

## 2016-03-08 MED ORDER — SILDENAFIL CITRATE 100 MG PO TABS: 100.0000 mg | ORAL_TABLET | ORAL | 5 refills | Status: DC | PRN

## 2016-03-08 MED ORDER — MUCINEX DM MAXIMUM STRENGTH 60-1200 MG PO TB12: 1.0000 | ORAL_TABLET | Freq: Two times a day (BID) | ORAL | 1 refills | Status: DC

## 2016-03-08 MED ORDER — IPRATROPIUM BROMIDE 0.03 % NA SOLN: 2.0000 | Freq: Two times a day (BID) | NASAL | 0 refills | Status: DC

## 2016-03-08 NOTE — Patient Instructions (Addendum)
- We will treat this as a respiratory viral infection.  - I recommend you rest, drink plenty of fluids, eat light meals including soups.  - You may use cough syrup at night for your cough and sore throat, Tessalon pearls during the day. Be aware that cough syrup can definitely make you drowsy and sleepy so do not drive or operate any heavy machinery if it is affecting you during the day.  - You may also use Tylenol or ibuprofen over-the-counter for your sore throat.  -You can use OTC eye drops for dry irritating eyes and throat lozenges for sore throat. - Please let me know if you are not seeing any improvement or get worse in 7-10 days.   Adenovirus Adenoviruses are common viruses that cause many different types of infections. The common cold (upper respiratory infection) is the most common type of infection from an adenovirus. Adenoviruses can also infect your digestive system, eyes, lungs, and bladder. An adenovirus spreads easily from person to person. This is especially true if you are in close contact with someone who is sick. You can breathe in the virus after a sick person coughs or sneezes. Adenoviruses can live outside the body for many weeks. Therefore, you can also get sick after touching an object the virus is living on and then touching your nose or mouth. Adenovirus infections are usually not serious unless you have another health problem that makes it hard for you to fight off the infection.  CAUSES  There are more than 50 types of adenoviruses that can cause infections in humans. Different types of adenoviruses cause different types of infection.  RISK FACTORS The risk for an adenovirus infection is higher for:  People who spend a lot of time in places where there are lots of other people. This includes schools, summer camps, and day care centers.  Babies.  Elderly people.  People with a weak body defense system (immune system).  People with heart or lung disease. SIGNS AND  SYMPTOMS  It can take 2-14 days to develop symptoms after the virus gets into your body. Symptoms may include:  Fever.  Sore throat.  Ear pain or fullness.  Nasal congestion.  Cough.  Difficulty breathing.  Stomachache.  Diarrhea.  Pain while passing urine.  Blood in the urine.  Pinkeye (conjunctivitis). DIAGNOSIS  Your health care provider may diagnose an adenovirus infection from your signs and symptoms. A physical exam will also be done. You may have tests to make sure your symptoms are not caused by another type of problem. These can include a blood test, throat culture, or chest X-ray. TREATMENT  There is no treatment for an adenovirus infection. These infections usually clear up on their own with home care. Your health care provider may recommend over-the-counter medicine to help relieve a sore throat, fever, or headache. HOME CARE INSTRUCTIONS  Rest at home until your symptoms go away.  Drink enough fluid to keep your urine clear or pale yellow.  Take medicines only as directed by your health care provider. PREVENTION   Wash your hands often with soap and water.  Avoid close contact with people who are sick.  Do not go to school or work when you are sick.  Cover your nose or mouth when you sneeze or cough. SEEK MEDICAL CARE IF: Your symptoms of adenovirus infection do not clear up or are getting worse after several days. SEEK IMMEDIATE MEDICAL CARE IF: You have trouble breathing. MAKE SURE YOU:  Understand these  instructions.  Will watch your condition.  Will get help right away if you are not doing well or get worse.   This information is not intended to replace advice given to you by your health care provider. Make sure you discuss any questions you have with your health care provider.   Document Released: 10/19/2002 Document Revised: 08/19/2014 Document Reviewed: 12/01/2013 Elsevier Interactive Patient Education 2016 [employer].    IF you  received an x-ray today, you will receive an invoice from [facility]. Please contact [facility] at [phone] with questions or concerns regarding your invoice.   IF you received labwork today, you will receive an invoice from [employer]. Please contact [facility] at [phone] with questions or concerns regarding your invoice.   Our billing staff will not be able to assist you with questions regarding bills from these companies.  You will be contacted with the lab results as soon as they are available. The fastest way to get your results is to activate your My Chart account. Instructions are located on the last page of this paperwork. If you have not heard from [place] regarding the results in 2 weeks, please contact this office.

## 2016-03-08 NOTE — Progress Notes (Signed)
    MRN: [id number] DOB: 1965/08/21  Subjective:   Dakota Miller is a 50 y.o. male presenting for chief complaint of Sore Throat (body aches; chills); Headache; Fever; and Medication Refill (viagra)  Reports one day history of subjective fever, sinus headache, sinus congestion, rhinorrhea, itchy watery eyes, sore throat, dry cough (intermittently) and myalgia, fatigue. Has not tried anything for relief.   Denies  ear fullness, ear pain, ear drainage, difficulty swallowing, wheezing, shortness of breath, chest tightness and chest pain, decreased appetite, weight loss, nausea, vomiting, abdominal pain and diarrhea. Has not had exposure with sick contacts at work or at home. No history of seasonal allergies, history of asthma. Patient did not have flu shot this season. Denies smoking, occasional alcohol use. Denies any other aggravating or relieving factors, no other questions or concerns.  Dakota Miller has a current medication list which includes the following prescription(s): sildenafil. Also has No Known Allergies.  Dakota Miller  has a past medical history of Erectile dysfunction. Also  has no past surgical history on file.  Objective:   Vitals: BP 124/80 (BP Location: Right Arm, Patient Position: Sitting, Cuff Size: Large)   Pulse (!) 108   Temp 99.9 F (37.7 C) (Oral)   Resp 18   Ht 6\' 4"  (1.93 m)   Wt 224 lb 12.8 oz (102 kg)   SpO2 96%   BMI 27.36 kg/m   Physical Exam  Constitutional: He is oriented to person, place, and time. He appears well-developed and well-nourished.  HENT:  Head: Normocephalic and atraumatic.  Right Ear: Hearing, tympanic membrane and ear canal normal.  Left Ear: Hearing, tympanic membrane and ear canal normal.  Nose: Mucosal edema and rhinorrhea present.  Mouth/Throat: Uvula is midline. Posterior oropharyngeal erythema present.  Eyes: EOM are normal. Right conjunctiva is injected. Left conjunctiva is injected.  Neck: Normal range of motion.  Pulmonary/Chest:  Effort normal.  Neurological: He is alert and oriented to person, place, and time.  Skin: Skin is warm and dry.  Psychiatric: He has a normal mood and affect.  Vitals reviewed.  No results found for this or any previous visit (from the past 24 hour(s)).  Assessment and Plan :  1. Erectile dysfunction, unspecified erectile dysfunction type - sildenafil (VIAGRA) 100 MG tablet; Take 1 tablet (100 mg total) by mouth as needed for erectile dysfunction.  Dispense: 4 tablet; Refill: 5  2. Acute upper respiratory infection  -Likely viral, will treat with supportive care, if no improvement in 10 days, follow up for further evaluation -OTC tylenol or ibuprofen, throat lozenges, and eye drops  -HYDROcodone-homatropine (HYCODAN) 5-1.5 MG/5ML syrup 5 mL; Take 5 mLs by mouth every 6 (six) hours as needed for cough. - Dextromethorphan-Guaifenesin (MUCINEX DM MAXIMUM STRENGTH) 60-1200 MG TB12; Take 1 tablet by mouth every 12 (twelve) hours.  Dispense: 30 each; Refill: 1 - Dextromethorphan-Guaifenesin (MUCINEX DM MAXIMUM STRENGTH) 60-1200 MG TB12; Take 1 tablet by mouth every 12 (twelve) hours.  Dispense: 30 each; Refill: 1 - ipratropium (ATROVENT) 0.03 % nasal spray; Place 2 sprays into both nostrils 2 (two) times daily.  Dispense: 30 mL; Refill: 0   Benjiman Core, PA-C  [facility] 03/08/2016 9:56 AM

## 2019-06-16 DIAGNOSIS — H1820 Unspecified corneal edema: Secondary | ICD-10-CM | POA: Diagnosis not present

## 2019-11-04 DIAGNOSIS — M25512 Pain in left shoulder: Secondary | ICD-10-CM | POA: Diagnosis not present

## 2019-11-04 DIAGNOSIS — M25511 Pain in right shoulder: Secondary | ICD-10-CM | POA: Diagnosis not present

## 2019-11-04 DIAGNOSIS — M542 Cervicalgia: Secondary | ICD-10-CM | POA: Diagnosis not present

## 2019-12-30 DIAGNOSIS — M7542 Impingement syndrome of left shoulder: Secondary | ICD-10-CM | POA: Diagnosis not present

## 2019-12-30 DIAGNOSIS — M9902 Segmental and somatic dysfunction of thoracic region: Secondary | ICD-10-CM | POA: Diagnosis not present

## 2019-12-30 DIAGNOSIS — S46012A Strain of muscle(s) and tendon(s) of the rotator cuff of left shoulder, initial encounter: Secondary | ICD-10-CM | POA: Diagnosis not present

## 2019-12-30 DIAGNOSIS — M9901 Segmental and somatic dysfunction of cervical region: Secondary | ICD-10-CM | POA: Diagnosis not present

## 2020-01-04 DIAGNOSIS — M9902 Segmental and somatic dysfunction of thoracic region: Secondary | ICD-10-CM | POA: Diagnosis not present

## 2020-01-04 DIAGNOSIS — M9901 Segmental and somatic dysfunction of cervical region: Secondary | ICD-10-CM | POA: Diagnosis not present

## 2020-01-04 DIAGNOSIS — S46012A Strain of muscle(s) and tendon(s) of the rotator cuff of left shoulder, initial encounter: Secondary | ICD-10-CM | POA: Diagnosis not present

## 2020-01-04 DIAGNOSIS — M7542 Impingement syndrome of left shoulder: Secondary | ICD-10-CM | POA: Diagnosis not present

## 2020-01-06 DIAGNOSIS — M9901 Segmental and somatic dysfunction of cervical region: Secondary | ICD-10-CM | POA: Diagnosis not present

## 2020-01-06 DIAGNOSIS — M9902 Segmental and somatic dysfunction of thoracic region: Secondary | ICD-10-CM | POA: Diagnosis not present

## 2020-01-06 DIAGNOSIS — S46012A Strain of muscle(s) and tendon(s) of the rotator cuff of left shoulder, initial encounter: Secondary | ICD-10-CM | POA: Diagnosis not present

## 2020-01-06 DIAGNOSIS — M7542 Impingement syndrome of left shoulder: Secondary | ICD-10-CM | POA: Diagnosis not present

## 2020-01-13 DIAGNOSIS — M9902 Segmental and somatic dysfunction of thoracic region: Secondary | ICD-10-CM | POA: Diagnosis not present

## 2020-01-13 DIAGNOSIS — S46012A Strain of muscle(s) and tendon(s) of the rotator cuff of left shoulder, initial encounter: Secondary | ICD-10-CM | POA: Diagnosis not present

## 2020-01-13 DIAGNOSIS — M9901 Segmental and somatic dysfunction of cervical region: Secondary | ICD-10-CM | POA: Diagnosis not present

## 2020-01-13 DIAGNOSIS — M7542 Impingement syndrome of left shoulder: Secondary | ICD-10-CM | POA: Diagnosis not present

## 2020-01-20 DIAGNOSIS — M9902 Segmental and somatic dysfunction of thoracic region: Secondary | ICD-10-CM | POA: Diagnosis not present

## 2020-01-20 DIAGNOSIS — M7542 Impingement syndrome of left shoulder: Secondary | ICD-10-CM | POA: Diagnosis not present

## 2020-01-20 DIAGNOSIS — M9901 Segmental and somatic dysfunction of cervical region: Secondary | ICD-10-CM | POA: Diagnosis not present

## 2020-01-20 DIAGNOSIS — S46012A Strain of muscle(s) and tendon(s) of the rotator cuff of left shoulder, initial encounter: Secondary | ICD-10-CM | POA: Diagnosis not present

## 2020-01-27 DIAGNOSIS — M9901 Segmental and somatic dysfunction of cervical region: Secondary | ICD-10-CM | POA: Diagnosis not present

## 2020-01-27 DIAGNOSIS — M9902 Segmental and somatic dysfunction of thoracic region: Secondary | ICD-10-CM | POA: Diagnosis not present

## 2020-01-27 DIAGNOSIS — S46012A Strain of muscle(s) and tendon(s) of the rotator cuff of left shoulder, initial encounter: Secondary | ICD-10-CM | POA: Diagnosis not present

## 2020-01-27 DIAGNOSIS — M7542 Impingement syndrome of left shoulder: Secondary | ICD-10-CM | POA: Diagnosis not present

## 2020-05-15 DIAGNOSIS — L309 Dermatitis, unspecified: Secondary | ICD-10-CM | POA: Diagnosis not present

## 2020-09-28 DIAGNOSIS — R059 Cough, unspecified: Secondary | ICD-10-CM | POA: Diagnosis not present

## 2020-09-28 DIAGNOSIS — Z03818 Encounter for observation for suspected exposure to other biological agents ruled out: Secondary | ICD-10-CM | POA: Diagnosis not present

## 2020-09-28 DIAGNOSIS — R0981 Nasal congestion: Secondary | ICD-10-CM | POA: Diagnosis not present

## 2020-09-28 DIAGNOSIS — B349 Viral infection, unspecified: Secondary | ICD-10-CM | POA: Diagnosis not present

## 2020-10-05 ENCOUNTER — Ambulatory Visit: Payer: Self-pay | Admitting: Family Medicine

## 2020-11-10 ENCOUNTER — Ambulatory Visit: Payer: Self-pay | Admitting: Family Medicine

## 2021-05-14 ENCOUNTER — Telehealth: Payer: Self-pay

## 2021-05-14 NOTE — Telephone Encounter (Signed)
error 

## 2021-06-12 ENCOUNTER — Other Ambulatory Visit: Payer: Self-pay

## 2021-06-12 ENCOUNTER — Encounter: Payer: Self-pay | Admitting: Family

## 2021-06-12 ENCOUNTER — Ambulatory Visit: Payer: BC Managed Care – PPO | Admitting: Family

## 2021-06-12 VITALS — BP 140/80 | HR 78 | Temp 98.3°F | Ht 76.0 in | Wt 214.6 lb

## 2021-06-12 DIAGNOSIS — Z23 Encounter for immunization: Secondary | ICD-10-CM | POA: Insufficient documentation

## 2021-06-12 DIAGNOSIS — F411 Generalized anxiety disorder: Secondary | ICD-10-CM | POA: Insufficient documentation

## 2021-06-12 DIAGNOSIS — L409 Psoriasis, unspecified: Secondary | ICD-10-CM | POA: Insufficient documentation

## 2021-06-12 DIAGNOSIS — M545 Low back pain, unspecified: Secondary | ICD-10-CM

## 2021-06-12 DIAGNOSIS — Z0000 Encounter for general adult medical examination without abnormal findings: Secondary | ICD-10-CM | POA: Diagnosis not present

## 2021-06-12 DIAGNOSIS — F410 Panic disorder [episodic paroxysmal anxiety] without agoraphobia: Secondary | ICD-10-CM | POA: Diagnosis not present

## 2021-06-12 DIAGNOSIS — Z0001 Encounter for general adult medical examination with abnormal findings: Secondary | ICD-10-CM | POA: Diagnosis not present

## 2021-06-12 DIAGNOSIS — L309 Dermatitis, unspecified: Secondary | ICD-10-CM

## 2021-06-12 DIAGNOSIS — Z7689 Persons encountering health services in other specified circumstances: Secondary | ICD-10-CM | POA: Insufficient documentation

## 2021-06-12 DIAGNOSIS — G8929 Other chronic pain: Secondary | ICD-10-CM

## 2021-06-12 LAB — COMPREHENSIVE METABOLIC PANEL
ALT: 51 U/L (ref 0–53)
AST: 59 U/L — ABNORMAL HIGH (ref 0–37)
Albumin: 4.8 g/dL (ref 3.5–5.2)
Alkaline Phosphatase: 65 U/L (ref 39–117)
BUN: 20 mg/dL (ref 6–23)
CO2: 31 mEq/L (ref 19–32)
Calcium: 9.7 mg/dL (ref 8.4–10.5)
Chloride: 99 mEq/L (ref 96–112)
Creatinine, Ser: 0.9 mg/dL (ref 0.40–1.50)
GFR: 96.56 mL/min (ref >60.00)
Glucose, Bld: 108 mg/dL — ABNORMAL HIGH (ref 70–99)
Potassium: 4.3 mEq/L (ref 3.5–5.1)
Sodium: 137 mEq/L (ref 135–145)
Total Bilirubin: 0.7 mg/dL (ref 0.2–1.2)
Total Protein: 7.5 g/dL (ref 6.0–8.3)

## 2021-06-12 LAB — TSH: TSH: 0.65 u[IU]/mL (ref 0.35–5.50)

## 2021-06-12 LAB — LIPID PANEL
Cholesterol: 201 mg/dL — ABNORMAL HIGH (ref 0–200)
HDL: 83.7 mg/dL (ref >39.00)
LDL Cholesterol: 105 mg/dL — ABNORMAL HIGH (ref 0–99)
NonHDL: 117.31
Total CHOL/HDL Ratio: 2
Triglycerides: 61 mg/dL (ref 0.0–149.0)
VLDL: 12.2 mg/dL (ref 0.0–40.0)

## 2021-06-12 LAB — CBC WITH DIFFERENTIAL/PLATELET
Basophils Absolute: 0 10*3/uL (ref 0.0–0.1)
Basophils Relative: 0.7 % (ref 0.0–3.0)
Eosinophils Absolute: 0.1 10*3/uL (ref 0.0–0.7)
Eosinophils Relative: 1 % (ref 0.0–5.0)
HCT: 45.6 % (ref 39.0–52.0)
Hemoglobin: 14.9 g/dL (ref 13.0–17.0)
Lymphocytes Relative: 18.7 % (ref 12.0–46.0)
Lymphs Abs: 1.1 10*3/uL (ref 0.7–4.0)
MCHC: 32.7 g/dL (ref 30.0–36.0)
MCV: 92.5 fl (ref 78.0–100.0)
Monocytes Absolute: 0.6 10*3/uL (ref 0.1–1.0)
Monocytes Relative: 10.7 % (ref 3.0–12.0)
Neutro Abs: 4 10*3/uL (ref 1.4–7.7)
Neutrophils Relative %: 68.9 % (ref 43.0–77.0)
Platelets: 280 10*3/uL (ref 150.0–400.0)
RBC: 4.93 Mil/uL (ref 4.22–5.81)
RDW: 13.8 % (ref 11.5–15.5)
WBC: 5.8 10*3/uL (ref 4.0–10.5)

## 2021-06-12 MED ORDER — BUPROPION HCL ER (SR) 100 MG PO TB12: 100.0000 mg | ORAL_TABLET | Freq: Two times a day (BID) | ORAL | 0 refills | Status: DC

## 2021-06-12 MED ORDER — TRIAMCINOLONE ACETONIDE 0.5 % EX OINT: TOPICAL_OINTMENT | Freq: Two times a day (BID) | CUTANEOUS | Status: DC

## 2021-06-12 NOTE — Assessment & Plan Note (Signed)
No exam in many years, getting labs today, flu vaccine administered, discussed colon cancer screening, pt prefers to do Cologuard.

## 2021-06-12 NOTE — Progress Notes (Signed)
Chief Complaint:  Dakota Miller is a 55 y.o. male who presents today for his annual comprehensive physical exam.  He has several acute complaints to also discuss today.    Subjective:  HPI:  Eczema: Patient complains of rash. Onset of symptoms years ago, and have been unchanged since that time.He reports exacerbations at different times of the year, no consistency. Treatment modalities that have been used in the past include: steroid creams and OTC eczema creams.   Anxiety: Patient complains of anxiety disorder, panic attacks, and sleep disturbance.   He has the following symptoms: insomnia, irritable, palpitations, racing thoughts, shortness of breath.  Onset of symptoms was approximately 3 years ago, He denies current suicidal and homicidal ideation.  Possible organic causes contributing are: none.  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   Previous treatment includes nothing.   Back Pain: Patient presents for evaluation of low back problems.  Symptoms have been present for a few years and include stiffness in lumbar . Initial inciting event: none. Symptoms are worst: all day. Alleviating factors identifiable by patient are medication tylenol and recumbency. Exacerbating factors identifiable by patient are bending forwards, bending sideways, running, and sitting. Treatments so far initiated by patient: none Previous lower back problems: none. Previous workup: none.    Depression screen PHQ 2/9 06/12/2021  Decreased Interest 0  Down, Depressed, Hopeless 0  PHQ - 2 Score 0    Health Maintenance Due  Topic Date Due   COVID-19 Vaccine (1) Never done   HIV Screening  Never done   Hepatitis C Screening  Never done   TETANUS/TDAP  Never done   COLONOSCOPY (Pts 45-58yrs Insurance coverage will need to be confirmed)  Never done   Zoster Vaccines- Shingrix (1 of 2) Never done    Outpatient Encounter Medications as of  06/12/2021  Medication Sig   buPROPion ER (WELLBUTRIN SR) 100 MG 12 hr tablet Take 1 tablet (100 mg total) by mouth 2 (two) times daily.   Multiple Vitamin (MULTIVITAMIN) capsule Take 1 capsule by mouth daily.   Multiple Vitamins-Minerals (EMERGEN-C IMMUNE PO) Take by mouth.   HYDROcodone-homatropine (HYCODAN) 5-1.5 MG/5ML syrup Take 5 mLs by mouth every 8 (eight) hours as needed for cough. (Patient not taking: Reported on 06/12/2021)   ipratropium (ATROVENT) 0.03 % nasal spray Place 2 sprays into both nostrils 2 (two) times daily. (Patient not taking: Reported on 06/12/2021)   sildenafil (VIAGRA) 100 MG tablet Take 1 tablet (100 mg total) by mouth as needed for erectile dysfunction. (Patient not taking: Reported on 06/12/2021)   [DISCONTINUED] Dextromethorphan-Guaifenesin (MUCINEX DM MAXIMUM STRENGTH) 60-1200 MG TB12 Take 1 tablet by mouth every 12 (twelve) hours. (Patient not taking: Reported on 06/12/2021)   Facility-Administered Encounter Medications as of 06/12/2021  Medication   triamcinolone ointment (KENALOG) 0.5 %   Review of Systems  Constitutional: Negative.   HENT: Negative.    Eyes: Negative.   Respiratory: Negative.    Cardiovascular: Negative.   Gastrointestinal: Negative.   Genitourinary: Negative.   Musculoskeletal:  Positive for back pain.  Skin:  Positive for itching and rash (bilateral hands).  Neurological: Negative.   Endo/Heme/Allergies: Negative.   Psychiatric/Behavioral:  Negative for depression. The patient is nervous/anxious.         Objective:   BP 140/80   Pulse 78   Temp 98.3 F (36.8 C) (Temporal)   Ht 6\' 4"  (1.93 m)   Wt 214 lb 9.6 oz (97.3 kg)  SpO2 97%   BMI 26.12 kg/m   Body mass index is 26.12 kg/m. Wt Readings from Last 3 Encounters:  06/12/21 214 lb 9.6 oz (97.3 kg)  03/08/16 224 lb 12.8 oz (102 kg)  11/09/15 233 lb (105.7 kg)  Physical Exam Skin:    General: Skin is warm and dry.     Findings: Rash (bilateral hands, with scaling)  present. No erythema. Rash is scaling (bilateral hands, fingers).  Psychiatric:        Attention and Perception: Attention normal.        Speech: Rapid and pressured: mildly.        Thought Content: Thought content normal.        Cognition and Memory: Cognition normal.  Gen: NAD, resting comfortably. HEENT: TMs normal bilaterally. OP clear. No thyromegaly noted.  CV: RRR with no murmurs appreciated Pulm: NWOB, CTAB with no crackles, wheezes, or rhonchi GI: Soft, Nontender, Nondistended. MSK: no edema, cyanosis, or clubbing noted Skin: warm, dry Neuro: CN2-12 grossly intact. Strength 5/5 in upper and lower extremities. Reflexes symmetric and intact bilaterally.  Psych: Normal affect and thought content     Assessment/Plan:   Problem List Items Addressed This Visit       Musculoskeletal and Integument   Eczema of both hands    Seen by [facility] in past, given steroid ointment that helped, does not like greasy feeling, advised just using at night with gloves. Use OTC eczema moisturizer every day, e.g. Eucerin, CereVe, etc.      Relevant Medications   triamcinolone ointment (KENALOG) 0.5 %     Other   Encounter to establish care with new doctor - Primary   Annual physical exam    No exam in many years, getting labs today, flu vaccine administered, discussed colon cancer screening, pt prefers to do Cologuard.      Relevant Orders   Cologuard   Comprehensive metabolic panel   TSH   Lipid panel   CBC with Differential/Platelet   Generalized anxiety disorder with panic attacks    Pt interested in speaking to a therapist, will also start generic Wellbutrin, advised pt to f/u in 1 month. Also discussed yoga, exercise, & meditation as other modalities and provided handout.      Relevant Medications   buPROPion ER (WELLBUTRIN SR) 100 MG 12 hr tablet   Other Relevant Orders   Ambulatory referral to Psychiatry   Need for immunization against influenza   Relevant Orders   Flu Vaccine  QUAD 45mo+IM (Fluarix, Fluzone & Alfiuria Quad PF) (Completed)   Chronic right-sided low back pain without sciatica    Owns a toy store, sometimes handles merchandise, lifting, carrying, re-stocking, etc. Denies any specific injury, pain is in right lumbar area. Advised on Ibuprofen tid or Aleve bid for 1-2 weeks at a time, use of ice after initial exacerbation of pain, then using heat for 30 minutes tid. Recommend Yoga with weight lifting for ongoing support.      Relevant Medications   buPROPion ER (WELLBUTRIN SR) 100 MG 12 hr tablet        Body mass index is 26.12 kg/m.   BMI Metric Follow Up - 06/12/21 0825       BMI Metric Follow Up-Please document annually   BMI Metric Follow Up Education provided              Preventative Healthcare: Ordering Cologuard today, administered first flu vaccine, pt has had 3 covid shots, will receive 4th later in the  week. Will discuss Shingles vaccine at next visit.  Patient Counseling(The following topics were reviewed and/or handout was given):  -Nutrition: Stressed importance of moderation in sodium/caffeine intake, saturated fat and cholesterol, caloric balance, sufficient intake of fresh fruits, vegetables, and fiber.  -Stressed the importance of regular exercise.   -Substance Abuse: Discussed cessation/primary prevention of tobacco, alcohol, or other drug use; driving or other dangerous activities under the influence; availability of treatment for abuse.   -Injury prevention: Discussed safety belts, safety helmets, smoke detector.   -Dental health: Discussed importance of regular tooth brushing, flossing, and dental visits.  -Health maintenance and immunizations reviewed. Please refer to Health maintenance section.

## 2021-06-12 NOTE — Assessment & Plan Note (Signed)
Seen by [facility] in past, given steroid ointment that helped, does not like greasy feeling, advised just using at night with gloves. Use OTC eczema moisturizer every day, e.g. Eucerin, CereVe, etc.

## 2021-06-12 NOTE — Assessment & Plan Note (Signed)
Owns a [employer], sometimes handles merchandise, lifting, carrying, re-stocking, etc. Denies any specific injury, pain is in right lumbar area. Advised on Ibuprofen tid or Aleve bid for 1-2 weeks at a time, use of ice after initial exacerbation of pain, then using heat for 30 minutes tid. Recommend Yoga with weight lifting for ongoing support.

## 2021-06-12 NOTE — Assessment & Plan Note (Signed)
Pt interested in speaking to a therapist, will also start generic Wellbutrin, advised pt to f/u in 1 month. Also discussed yoga, exercise, & meditation as other modalities and provided handout.

## 2021-06-12 NOTE — Patient Instructions (Signed)
Welcome to [facility]! It was a pleasure meeting you today.  As discussed, I have sent Bupropion medication to your pharmacy, you can start this today or tomorrow morning. It is taken every 12 hours, but start with 1 pill in the am only for 3-4 days, then increase as tolerated to twice a day. I have also sent a referral for counseling, they will contact you directly. An order for the Cologuard (colon cancer) screening has been sent and you should receive information about this via a phone call. Please schedule a 1 month follow up today, and we can see you sooner for any concerns if needed.   PLEASE NOTE:  If you had any LAB tests please let us know if you have not heard back within a few days. You may see your results on MyChart before we have a chance to review them but we will give you a call once they are reviewed by [place]. If we ordered any REFERRALS today, please let us know if you have not heard from their office within the next week.  Let us know through MyChart if you are needing REFILLS, or have your pharmacy send [place] the request. You can also use MyChart to communicate with me or any office staff.  Please try these tips to maintain a healthy lifestyle:  Eat most of your calories during the day when you are active. Eliminate processed foods including packaged sweets (pies, cakes, cookies), reduce intake of potatoes, white bread, white pasta, and white rice. Look for whole grain options, oat flour or almond flour.  Each meal should contain half fruits/vegetables, one quarter protein, and one quarter carbs (no bigger than a computer mouse).  Cut down on sweet beverages. This includes juice, soda, and sweet tea. Also watch fruit intake, though this is a healthier sweet option, it still contains natural sugar! Limit to 3 servings daily.  Drink at least 1 glass of water with each meal and aim for at least 8 glasses per day  Exercise at least 150 minutes every  week.

## 2021-06-17 DIAGNOSIS — Z0000 Encounter for general adult medical examination without abnormal findings: Secondary | ICD-10-CM | POA: Diagnosis not present

## 2021-06-23 LAB — COLOGUARD: COLOGUARD: NEGATIVE

## 2021-07-11 ENCOUNTER — Other Ambulatory Visit: Payer: Self-pay

## 2021-07-11 ENCOUNTER — Ambulatory Visit: Payer: BC Managed Care – PPO | Admitting: Family

## 2021-07-11 ENCOUNTER — Encounter: Payer: Self-pay | Admitting: Family

## 2021-07-11 VITALS — BP 130/78 | HR 82 | Temp 98.4°F | Ht 76.0 in | Wt 213.2 lb

## 2021-07-11 DIAGNOSIS — F411 Generalized anxiety disorder: Secondary | ICD-10-CM | POA: Diagnosis not present

## 2021-07-11 DIAGNOSIS — L309 Dermatitis, unspecified: Secondary | ICD-10-CM | POA: Diagnosis not present

## 2021-07-11 DIAGNOSIS — F410 Panic disorder [episodic paroxysmal anxiety] without agoraphobia: Secondary | ICD-10-CM

## 2021-07-11 DIAGNOSIS — H9193 Unspecified hearing loss, bilateral: Secondary | ICD-10-CM | POA: Insufficient documentation

## 2021-07-11 MED ORDER — BUPROPION HCL ER (SR) 100 MG PO TB12: 100.0000 mg | ORAL_TABLET | Freq: Two times a day (BID) | ORAL | 2 refills | Status: DC

## 2021-07-11 MED ORDER — TRIAMCINOLONE ACETONIDE 0.5 % EX OINT: 1.0000 "application " | TOPICAL_OINTMENT | Freq: Two times a day (BID) | CUTANEOUS | 1 refills | Status: DC

## 2021-07-11 NOTE — Patient Instructions (Signed)
It was very nice to see you today!  Increase your Bupropion to twice a day. If no side effects, but also no improvement in anxiety, increase to 2 pills in am, and 2 in the evening, or stay on 1 pill in the evening. I have sent a refill. The Eczema steroid ointment has been sent to your pharmacy, remember to use this first, twice a day, then cover with generic OTC eczema cream. Must keep hands moisturized as much as possible. I have sent a referral to the Audiologist for your hearing.  PLEASE NOTE:  If you had any lab tests please let us know if you have not heard back within a few days. You may see your results on MyChart before we have a chance to review them but we will give you a call once they are reviewed by [place]. If we ordered any referrals today, please let us know if you have not heard from their office within the next week.   Please try these tips to maintain a healthy lifestyle:  Eat most of your calories during the day when you are active. Eliminate processed foods including packaged sweets (pies, cakes, cookies), reduce intake of potatoes, white bread, white pasta, and white rice. Look for whole grain options, oat flour or almond flour.  Each meal should contain half fruits/vegetables, one quarter protein, and one quarter carbs (no bigger than a computer mouse).  Cut down on sweet beverages. This includes juice, soda, and sweet tea. Also watch fruit intake, though this is a healthier sweet option, it still contains natural sugar! Limit to 3 servings daily.  Drink at least 1 glass of water with each meal and aim for at least 8 glasses per day  Exercise at least 150 minutes every week.

## 2021-07-11 NOTE — Assessment & Plan Note (Signed)
No difference on Wellbutrin, pt never increased to bid dosing, discussed increasing dose or trying bid first, pt will try bid and let me know, f/u in either 1 or 2 months.

## 2021-07-11 NOTE — Progress Notes (Signed)
Subjective:     Patient ID: Dakota Miller, male    DOB: Feb 11, 1966, 55 y.o.   MRN: [id number]  Chief Complaint  Patient presents with   Follow-up    4 week follow-up on anxiety     HPI: Hearing Loss: Patient presents with a bilateral hearing loss. The patient reports that the mass has been present for 3 months. The onset of the hearing loss was gradual. Associated symptoms were positive for none of significance. There has not been a history of injury or extensive exposure to loud noises. The recent exposure history has been negative. Prior antibiotics/medications have included nothing of significance.  Eczema: Patient complains of rash. Onset of symptoms years ago, and have been unchanged since that time.He reports exacerbations at different times of the year, no consistency. Treatment modalities that have been used in the past include: steroid creams and OTC eczema creams. Pt reports today, he never received the steroid ointment ordered last visit. Anxiety: Patient complains of anxiety disorder, panic attacks, and sleep disturbance.   He has the following symptoms: insomnia, irritable, palpitations, racing thoughts, shortness of breath.  Onset of symptoms was approximately 3 years ago, He denies current suicidal and homicidal ideation.  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  Pt reports today no difference in symptoms since starting Wellbutrin, denies any side effects, did not go up to bid on dose.  Health Maintenance Due  Topic Date Due   COVID-19 Vaccine (1) Never done   HIV Screening  Never done   Hepatitis C Screening  Never done   TETANUS/TDAP  Never done   COLONOSCOPY (Pts 45-62yrs Insurance coverage will need to be confirmed)  Never done    Past Medical History:  Diagnosis Date   Anxiety    Erectile dysfunction     History reviewed. No pertinent surgical history.  Outpatient Medications Prior to Visit   Medication Sig Dispense Refill   Multiple Vitamin (MULTIVITAMIN) capsule Take 1 capsule by mouth daily.     Multiple Vitamins-Minerals (EMERGEN-C IMMUNE PO) Take by mouth.     buPROPion ER (WELLBUTRIN SR) 100 MG 12 hr tablet Take 1 tablet (100 mg total) by mouth 2 (two) times daily. 60 tablet 0   HYDROcodone-homatropine (HYCODAN) 5-1.5 MG/5ML syrup Take 5 mLs by mouth every 8 (eight) hours as needed for cough. (Patient not taking: Reported on 06/12/2021) 120 mL 0   ipratropium (ATROVENT) 0.03 % nasal spray Place 2 sprays into both nostrils 2 (two) times daily. (Patient not taking: Reported on 06/12/2021) 30 mL 0   sildenafil (VIAGRA) 100 MG tablet Take 1 tablet (100 mg total) by mouth as needed for erectile dysfunction. (Patient not taking: Reported on 06/12/2021) 4 tablet 5   triamcinolone ointment (KENALOG) 0.5 %      No facility-administered medications prior to visit.    No Known Allergies      Objective:   Physical Exam Skin:    General: Skin is warm and dry.     Findings: Rash (bilateral hands, with scaling) present. No erythema. Rash is scaling (bilateral hands, fingers).  Psychiatric:        Attention and Perception: Attention normal.        Speech: Rapid and pressured: mildly.        Thought Content: Thought content normal.        Cognition and Memory: Cognition normal.  Gen: NAD, resting comfortably. HEENT: TMs normal bilaterally. OP clear. No thyromegaly noted.  CV: RRR with no murmurs appreciated Pulm: NWOB, CTAB with no crackles, wheezes, or rhonchi GI: Soft, Nontender, Nondistended. MSK: no edema, cyanosis, or clubbing noted Skin: warm, dry Neuro: CN2-12 grossly intact. Strength 5/5 in upper and lower extremities. Reflexes symmetric and intact bilaterally.  Psych: Normal affect and thought content     BP 130/78   Pulse 82   Temp 98.4 F (36.9 C) (Temporal)   Ht 6\' 4"  (1.93 m)   Wt 213 lb 4 oz (96.7 kg)   SpO2 98%   BMI 25.96 kg/m  Wt Readings from Last 3  Encounters:  07/11/21 213 lb 4 oz (96.7 kg)  06/12/21 214 lb 9.6 oz (97.3 kg)  03/08/16 224 lb 12.8 oz (102 kg)       Assessment & Plan:   Problem List Items Addressed This Visit       Musculoskeletal and Integument   Eczema of both hands - Primary    Pt never received steroid ointment from last visit, resent today, advised again on how to use, cover with OTC eczema cream bid.      Relevant Medications   triamcinolone ointment (KENALOG) 0.5 %     Other   Generalized anxiety disorder with panic attacks    No difference on Wellbutrin, pt never increased to bid dosing, discussed increasing dose or trying bid first, pt will try bid and let me know, f/u in either 1 or 2 months.      Relevant Medications   buPROPion ER (WELLBUTRIN SR) 100 MG 12 hr tablet   Decreased hearing of both ears   Relevant Orders   Ambulatory referral to Audiology    Meds ordered this encounter  Medications   buPROPion ER (WELLBUTRIN SR) 100 MG 12 hr tablet    Sig: Take 1 tablet (100 mg total) by mouth 2 (two) times daily.    Dispense:  60 tablet    Refill:  2    Order Specific Question:   Supervising Provider    Answer:   ANDY, CAMILLE L [[id number]]   triamcinolone ointment (KENALOG) 0.5 %    Sig: Apply 1 application topically 2 (two) times daily.    Dispense:  30 g    Refill:  1    Order Specific Question:   Supervising Provider    Answer:   ANDY, CAMILLE L [[id number]]

## 2021-07-11 NOTE — Assessment & Plan Note (Signed)
Pt never received steroid ointment from last visit, resent today, advised again on how to use, cover with OTC eczema cream bid.

## 2021-08-09 ENCOUNTER — Ambulatory Visit: Payer: BC Managed Care – PPO | Admitting: Family

## 2021-09-12 ENCOUNTER — Encounter: Payer: Self-pay | Admitting: Family

## 2021-09-18 ENCOUNTER — Encounter: Payer: Self-pay | Admitting: Family Medicine

## 2021-09-18 ENCOUNTER — Ambulatory Visit (INDEPENDENT_AMBULATORY_CARE_PROVIDER_SITE_OTHER): Payer: BC Managed Care – PPO | Admitting: Family Medicine

## 2021-09-18 VITALS — BP 158/90 | HR 75 | Temp 98.0°F | Ht 76.0 in | Wt 204.0 lb

## 2021-09-18 DIAGNOSIS — L309 Dermatitis, unspecified: Secondary | ICD-10-CM

## 2021-09-18 DIAGNOSIS — J029 Acute pharyngitis, unspecified: Secondary | ICD-10-CM

## 2021-09-18 DIAGNOSIS — R059 Cough, unspecified: Secondary | ICD-10-CM

## 2021-09-18 DIAGNOSIS — R519 Headache, unspecified: Secondary | ICD-10-CM | POA: Diagnosis not present

## 2021-09-18 LAB — POC COVID19 BINAXNOW: SARS Coronavirus 2 Ag: NEGATIVE

## 2021-09-18 MED ORDER — TRIAMCINOLONE ACETONIDE 0.5 % EX OINT: 1.0000 "application " | TOPICAL_OINTMENT | Freq: Two times a day (BID) | CUTANEOUS | 1 refills | Status: DC

## 2021-09-18 NOTE — Progress Notes (Signed)
Subjective:     Patient ID: ERNESTO ZUKOWSKI, male    DOB: November 18, 1965, 56 y.o.   MRN: [id number]  Chief Complaint  Patient presents with   Cough    Sx started Sunday Productive cough, yellowish/green mucus Has been taking tylenol and motrin   Headache   Sore Throat   Nasal Congestion    HPI Chief complaint: cough-more from sinuses/congestion Symptom onset: 2/5 Pertinent positives: productive cough-yellow/green, HA, burning in sinuses.  Felt bac this am. sore throat, congestion Pertinent negatives: fever/chills/sob,n/v/d. Covid neg here Treatments tried: tylenol/motrin Vaccine status: all 4 covid vaccines Sick exposure: work-sinus    Eczema-stopped drinking grapefruit juice and better.  Stopped wellbutrin 2 days ago-didn't think helped.   Health Maintenance Due  Topic Date Due   COVID-19 Vaccine (1) Never done   HIV Screening  Never done   Hepatitis C Screening  Never done   TETANUS/TDAP  Never done   COLONOSCOPY (Pts 45-34yrs Insurance coverage will need to be confirmed)  Never done    Past Medical History:  Diagnosis Date   Anxiety    Erectile dysfunction     History reviewed. No pertinent surgical history.  Outpatient Medications Prior to Visit  Medication Sig Dispense Refill   buPROPion ER (WELLBUTRIN SR) 100 MG 12 hr tablet Take 1 tablet (100 mg total) by mouth 2 (two) times daily. 60 tablet 2   Multiple Vitamin (MULTIVITAMIN) capsule Take 1 capsule by mouth daily.     Multiple Vitamins-Minerals (EMERGEN-C IMMUNE PO) Take by mouth.     triamcinolone ointment (KENALOG) 0.5 % Apply 1 application topically 2 (two) times daily. 30 g 1   No facility-administered medications prior to visit.    No Known Allergies PIR:[id number] except as noted in HPI      Objective:     BP (!) 158/90    Pulse 75    Temp 98 F (36.7 C) (Temporal)    Ht 6\' 4"  (1.93 m)    Wt 204 lb (92.5 kg)    SpO2 100%    BMI 24.83 kg/m  Wt Readings from Last 3  Encounters:  09/18/21 204 lb (92.5 kg)  07/11/21 213 lb 4 oz (96.7 kg)  06/12/21 214 lb 9.6 oz (97.3 kg)        Gen: WDWN NAD WM HEENT: NCAT, conjunctiva not injected, sclera nonicteric TM WNL B, OP moist, no exudates    congested.  Sinuses NT NECK:  supple, ? thyromegaly, no nodes, no carotid bruits CARDIAC: RRR, S1S2+, no murmur. DP 2+B LUNGS: CTAB. No wheezes EXT:  no edema MSK: no gross abnormalities.  NEURO: A&O x3.  CN II-XII intact.  PSYCH: normal mood. Good eye contact  Results for orders placed or performed in visit on 09/18/21  POC COVID-19  Result Value Ref Range   SARS Coronavirus 2 Ag Negative Negative     Assessment & Plan:   Problem List Items Addressed This Visit       Musculoskeletal and Integument   Eczema of both hands     Other   Nonintractable headache   Relevant Orders   POC COVID-19 (Completed)   Other Visit Diagnoses     Cough, unspecified type    -  Primary   Relevant Orders   POC COVID-19 (Completed)   Sore throat       Relevant Orders   POC COVID-19 (Completed)      URI-may still be covid.  Symptomatic tx.   Eczema-renewed meds ? Thyromegaly-could  be viral.  Reck w/PCP few wks  No orders of the defined types were placed in this encounter.   Angelena Sole, MD

## 2021-09-18 NOTE — Patient Instructions (Signed)
Meds have been sent the the pharmacy °You can take tylenol for pain/fevers °If worsening symptoms, let us know or go to the Emergency room  ° ° °

## 2022-05-02 ENCOUNTER — Telehealth (INDEPENDENT_AMBULATORY_CARE_PROVIDER_SITE_OTHER): Payer: BC Managed Care – PPO | Admitting: Family

## 2022-05-02 ENCOUNTER — Encounter: Payer: Self-pay | Admitting: Family

## 2022-05-02 VITALS — Ht 76.0 in | Wt 210.0 lb

## 2022-05-02 DIAGNOSIS — U071 COVID-19: Secondary | ICD-10-CM

## 2022-05-02 NOTE — Progress Notes (Signed)
MyChart Video Visit    Virtual Visit via Video Note   This format is felt to be most appropriate for this patient at this time. Physical exam was limited by quality of the video and audio technology used for the visit. CMA was able to get the patient set up on a video visit.  Patient location: Home. Patient and provider in visit Provider location: Office  I discussed the limitations of evaluation and management by telemedicine and the availability of in person appointments. The patient expressed understanding and agreed to proceed.  Visit Date: 05/02/2022  Today's healthcare provider: Dulce Sellar, NP     Subjective:   Patient ID: Dakota Miller, male    DOB: Aug 07, 1966, 56 y.o.   MRN: [id number]  Chief Complaint  Patient presents with   Covid Positive    tested yesterday.    HPI Upper Respiratory Infection:    pt tested positive for covid yesterday. Symptoms started yesterday morning and includes a headache, sore throat, sinus pressure/burning, chills, sweats, body aches. Has tried motrin which did help. Reports he feels a little better today than yesterday. He is aking for advice on whether to take the antiviral or not.   Assessment & Plan:   Problem List Items Addressed This Visit   None Visit Diagnoses     COVID-19    -  Primary   Pt reports doing better today. Advised pt he is not a high risk for severe illness, and the anitviral is optional, denies living or caring for anyone of high risk. Pt choosing to not take the antiviral.  Advised of CDC guidelines for masking if out in public. OK to continue taking OTC sinus or pain meds. Encouraged to monitor & notify office of any worsening symptoms: increased shortness of breath, weakness, and signs of dehydration. Instructed to rest and hydrate well.        Past Medical History:  Diagnosis Date   Anxiety    Erectile dysfunction     History reviewed. No pertinent surgical history.  Outpatient  Medications Prior to Visit  Medication Sig Dispense Refill   Multiple Vitamins-Minerals (EMERGEN-C IMMUNE PO) Take by mouth.     buPROPion ER (WELLBUTRIN SR) 100 MG 12 hr tablet Take 1 tablet (100 mg total) by mouth 2 (two) times daily. 60 tablet 2   Multiple Vitamin (MULTIVITAMIN) capsule Take 1 capsule by mouth daily.     triamcinolone ointment (KENALOG) 0.5 % Apply 1 application topically 2 (two) times daily. 30 g 1   No facility-administered medications prior to visit.    No Known Allergies     Objective:   Physical Exam Vitals and nursing note reviewed.  Constitutional:      General: She is not in acute distress.    Appearance: Normal appearance.  HENT:     Head: Normocephalic.  Pulmonary:     Effort: No respiratory distress.  Musculoskeletal:     Cervical back: Normal range of motion.  Skin:    General: Skin is dry.     Coloration: Skin is not pale.  Neurological:     Mental Status: She is alert and oriented to person, place, and time.  Psychiatric:        Mood and Affect: Mood normal.   Ht 6\' 4"  (1.93 m)   Wt 210 lb (95.3 kg)   BMI 25.56 kg/m   Wt Readings from Last 3 Encounters:  05/02/22 210 lb (95.3 kg)  09/18/21 204 lb (92.5 kg)  07/11/21 213 lb 4 oz (96.7 kg)      I discussed the assessment and treatment plan with the patient. The patient was provided an opportunity to ask questions and all were answered. The patient agreed with the plan and demonstrated an understanding of the instructions.   The patient was advised to call back or seek an in-person evaluation if the symptoms worsen or if the condition fails to improve as anticipated.  Jeanie Sewer, NP [place] [phone] (phone) [phone] (fax)  [place]

## 2022-05-06 ENCOUNTER — Encounter: Payer: Self-pay | Admitting: *Deleted

## 2022-07-25 ENCOUNTER — Encounter: Payer: Self-pay | Admitting: *Deleted

## 2023-01-30 DIAGNOSIS — L409 Psoriasis, unspecified: Secondary | ICD-10-CM | POA: Diagnosis not present

## 2023-02-21 ENCOUNTER — Emergency Department (HOSPITAL_BASED_OUTPATIENT_CLINIC_OR_DEPARTMENT_OTHER): Payer: BC Managed Care – PPO

## 2023-02-21 ENCOUNTER — Encounter (HOSPITAL_BASED_OUTPATIENT_CLINIC_OR_DEPARTMENT_OTHER): Payer: Self-pay

## 2023-02-21 ENCOUNTER — Telehealth: Payer: Self-pay | Admitting: Family

## 2023-02-21 ENCOUNTER — Emergency Department (HOSPITAL_BASED_OUTPATIENT_CLINIC_OR_DEPARTMENT_OTHER)
Admission: EM | Admit: 2023-02-21 | Discharge: 2023-02-21 | Disposition: A | Discharge status: Home / Self Care | Payer: BC Managed Care – PPO | Attending: Emergency Medicine | Admitting: Emergency Medicine

## 2023-02-21 ENCOUNTER — Other Ambulatory Visit: Payer: Self-pay

## 2023-02-21 DIAGNOSIS — R110 Nausea: Secondary | ICD-10-CM | POA: Insufficient documentation

## 2023-02-21 DIAGNOSIS — R7989 Other specified abnormal findings of blood chemistry: Secondary | ICD-10-CM | POA: Insufficient documentation

## 2023-02-21 DIAGNOSIS — H8110 Benign paroxysmal vertigo, unspecified ear: Secondary | ICD-10-CM | POA: Insufficient documentation

## 2023-02-21 DIAGNOSIS — D72829 Elevated white blood cell count, unspecified: Secondary | ICD-10-CM | POA: Diagnosis not present

## 2023-02-21 DIAGNOSIS — Z87891 Personal history of nicotine dependence: Secondary | ICD-10-CM | POA: Insufficient documentation

## 2023-02-21 DIAGNOSIS — H8112 Benign paroxysmal vertigo, left ear: Secondary | ICD-10-CM

## 2023-02-21 DIAGNOSIS — R55 Syncope and collapse: Secondary | ICD-10-CM | POA: Diagnosis not present

## 2023-02-21 DIAGNOSIS — R42 Dizziness and giddiness: Secondary | ICD-10-CM | POA: Diagnosis not present

## 2023-02-21 DIAGNOSIS — R9431 Abnormal electrocardiogram [ECG] [EKG]: Secondary | ICD-10-CM | POA: Diagnosis not present

## 2023-02-21 DIAGNOSIS — I6782 Cerebral ischemia: Secondary | ICD-10-CM | POA: Diagnosis not present

## 2023-02-21 LAB — CBC WITH DIFFERENTIAL/PLATELET
Abs Immature Granulocytes: 0.09 10*3/uL — ABNORMAL HIGH (ref 0.00–0.07)
Basophils Absolute: 0 10*3/uL (ref 0.0–0.1)
Basophils Relative: 0 %
Eosinophils Absolute: 0 10*3/uL (ref 0.0–0.5)
Eosinophils Relative: 0 %
HCT: 42 % (ref 39.0–52.0)
Hemoglobin: 14.1 g/dL (ref 13.0–17.0)
Immature Granulocytes: 1 %
Lymphocytes Relative: 5 %
Lymphs Abs: 0.8 10*3/uL (ref 0.7–4.0)
MCH: 31.2 pg (ref 26.0–34.0)
MCHC: 33.6 g/dL (ref 30.0–36.0)
MCV: 92.9 fL (ref 80.0–100.0)
Monocytes Absolute: 0.7 10*3/uL (ref 0.1–1.0)
Monocytes Relative: 4 %
Neutro Abs: 14.5 10*3/uL — ABNORMAL HIGH (ref 1.7–7.7)
Neutrophils Relative %: 90 %
Platelets: 277 10*3/uL (ref 150–400)
RBC: 4.52 MIL/uL (ref 4.22–5.81)
RDW: 12.8 % (ref 11.5–15.5)
WBC: 16.1 10*3/uL — ABNORMAL HIGH (ref 4.0–10.5)
nRBC: 0 % (ref 0.0–0.2)

## 2023-02-21 LAB — COMPREHENSIVE METABOLIC PANEL
ALT: 23 U/L (ref 0–44)
AST: 18 U/L (ref 15–41)
Albumin: 4.5 g/dL (ref 3.5–5.0)
Alkaline Phosphatase: 56 U/L (ref 38–126)
Anion gap: 9 (ref 5–15)
BUN: 22 mg/dL — ABNORMAL HIGH (ref 6–20)
CO2: 27 mmol/L (ref 22–32)
Calcium: 9.1 mg/dL (ref 8.9–10.3)
Chloride: 102 mmol/L (ref 98–111)
Creatinine, Ser: 0.73 mg/dL (ref 0.61–1.24)
GFR, Estimated: >60 mL/min (ref >60)
Glucose, Bld: 143 mg/dL — ABNORMAL HIGH (ref 70–99)
Potassium: 4.6 mmol/L (ref 3.5–5.1)
Sodium: 138 mmol/L (ref 135–145)
Total Bilirubin: 0.5 mg/dL (ref 0.3–1.2)
Total Protein: 6.9 g/dL (ref 6.5–8.1)

## 2023-02-21 LAB — LIPASE, BLOOD: Lipase: 16 U/L (ref 11–51)

## 2023-02-21 LAB — TROPONIN I (HIGH SENSITIVITY)
Troponin I (High Sensitivity): 6 ng/L (ref <18)
Troponin I (High Sensitivity): 6 ng/L (ref <18)

## 2023-02-21 MED ORDER — MAGNESIUM SULFATE 2 GM/50ML IV SOLN
2.0000 g | Freq: Once | INTRAVENOUS | Status: AC
  Administered 2023-02-21: 2 g via INTRAVENOUS
  Filled 2023-02-21: qty 50

## 2023-02-21 MED ORDER — MECLIZINE HCL 25 MG PO TABS
25.0000 mg | ORAL_TABLET | Freq: Once | ORAL | Status: AC
  Administered 2023-02-21: 25 mg via ORAL
  Filled 2023-02-21: qty 1

## 2023-02-21 MED ORDER — SODIUM CHLORIDE 0.9 % IV BOLUS
1000.0000 mL | Freq: Once | INTRAVENOUS | Status: AC
  Administered 2023-02-21: 1000 mL via INTRAVENOUS

## 2023-02-21 MED ORDER — METOCLOPRAMIDE HCL 5 MG/ML IJ SOLN
5.0000 mg | Freq: Once | INTRAMUSCULAR | Status: AC
  Administered 2023-02-21: 5 mg via INTRAVENOUS
  Filled 2023-02-21: qty 2

## 2023-02-21 MED ORDER — DEXAMETHASONE 4 MG PO TABS
10.0000 mg | ORAL_TABLET | Freq: Once | ORAL | Status: AC
  Administered 2023-02-21: 10 mg via ORAL
  Filled 2023-02-21: qty 3

## 2023-02-21 MED ORDER — ONDANSETRON HCL 4 MG/2ML IJ SOLN
4.0000 mg | Freq: Once | INTRAMUSCULAR | Status: AC
  Administered 2023-02-21: 4 mg via INTRAVENOUS
  Filled 2023-02-21: qty 2

## 2023-02-21 MED ORDER — MECLIZINE HCL 25 MG PO TABS: 25.0000 mg | ORAL_TABLET | Freq: Three times a day (TID) | ORAL | 0 refills | Status: DC | PRN

## 2023-02-21 MED ORDER — DIPHENHYDRAMINE HCL 50 MG/ML IJ SOLN
12.5000 mg | Freq: Once | INTRAMUSCULAR | Status: AC
  Administered 2023-02-21: 12.5 mg via INTRAVENOUS
  Filled 2023-02-21: qty 1

## 2023-02-21 NOTE — ED Provider Notes (Signed)
Received patient in turnover from Dr. Wallace Cullens.  Please see their note for further details of Hx, PE.  Briefly patient is a 57 y.o. male with a Dizziness .  Patient with what sounds like positional vertigo still having significant symptoms.  Plan for MRI.  MRI is negative.  I went to reassess the patient.  He does have a positive Dix-Hallpike for me on the left.  Epley maneuver performed with some improvement.  Will start on meclizine.  Given neurology follow-up.  PCP follow-up as well.  CPT [id number].  I discussed therapeutic maneuver for the patient's BPPV.  The patient consented to the procedure.  The patient went through a series of had maneuvers with transient dizziness and improvement.  Feeling better on reassessment. Adela Lank, Jesusita Oka, DO 02/21/23 1649

## 2023-02-21 NOTE — ED Triage Notes (Signed)
Pt states he was at work and suddenly became dizzy and nauseated. States it gets worse when he is moving around. Denies any chest pain or sob.

## 2023-02-21 NOTE — Telephone Encounter (Signed)
FYI: This call has been transferred to triage nurse: the Triage Nurse. Once the result note has been entered staff can address the message at that time.  Patient called in with the following symptoms:  Red Word:dizziness , shaking, feels like everything is spinning, weak, sweaty, vomiting   Please advise at Mobile [phone] (mobile)  Message is routed to Provider Pool.

## 2023-02-21 NOTE — Discharge Instructions (Signed)
Please return for worsening dizziness inability to walk one-sided numbness or weakness or difficulty speech or swallowing.

## 2023-02-21 NOTE — ED Provider Notes (Signed)
EMERGENCY DEPARTMENT AT [facility] Provider Note  CSN: [id number] Arrival date & time: 02/21/23 1215  Chief Complaint(s) Dizziness  HPI Dakota Miller is a 57 y.o. male with past medical history as below, significant for anxiety who presents to the ED with complaint of dizzy.  Patient here with sudden onset dizziness, onset earlier this morning.  Worsened by head movement, improved with keeping his eyes closed and being motionless.  Dizziness was very rapid onset.  Associate with nausea.  Feels fatigued.  Similar episode approximately 20 years ago. Does report difficulty walking secondary to the dizziness sensation described as a spinning movement.  No recent falls or head injuries.  No vision changes.  No numbness, tingling, chest pain, palpitations, fevers or chills.  No weakness to extremities.   Past Medical History Past Medical History:  Diagnosis Date   Anxiety    Erectile dysfunction    Patient Active Problem List   Diagnosis Date Noted   Nonintractable headache 09/18/2021   Decreased hearing of both ears 07/11/2021   Encounter to establish care with new doctor 06/12/2021   Annual physical exam 06/12/2021   Generalized anxiety disorder with panic attacks 06/12/2021   Eczema of both hands 06/12/2021   Need for immunization against influenza 06/12/2021   Chronic right-sided low back pain without sciatica 06/12/2021   Erectile dysfunction 06/02/2012   Home Medication(s) Prior to Admission medications   Medication Sig Start Date End Date Taking? Authorizing Provider  Multiple Vitamins-Minerals (EMERGEN-C IMMUNE PO) Take by mouth.    [provider]                                                                                                                                    Past Surgical History History reviewed. No pertinent surgical history. Family History Family History  Problem Relation Age of Onset   COPD Mother    Arthritis Mother     Hypertension Father    Hyperlipidemia Father    Early death Father    Heart disease Father    Stroke Father    Cancer Father        esophageal   Heart attack Father     Social History Social History   Tobacco Use   Smoking status: Former   Smokeless tobacco: Never  Substance Use Topics   Alcohol use: Yes    Alcohol/week: 28.0 standard drinks of alcohol    Types: 14 Shots of liquor, 14 Unspecified drink type per week   Drug use: No   Allergies Patient has no known allergies.  Review of Systems Review of Systems  Constitutional:  Positive for fatigue.  Gastrointestinal:  Positive for nausea and vomiting.  Neurological:  Positive for dizziness.  All other systems reviewed and are negative.   Physical Exam Vital Signs  I have reviewed the triage vital signs BP (!) 153/93   Pulse 79   Temp 97.7  F (36.5 C) (Oral)   SpO2 97%  Physical Exam Vitals and nursing note reviewed.  Constitutional:      General: He is not in acute distress.    Appearance: Normal appearance. He is well-developed. He is not ill-appearing.  HENT:     Head: Normocephalic and atraumatic.     Right Ear: External ear normal.     Left Ear: External ear normal.     Mouth/Throat:     Mouth: Mucous membranes are moist.  Eyes:     General: Vision grossly intact. Gaze aligned appropriately. No scleral icterus.    Extraocular Movements: Extraocular movements intact.     Right eye: Nystagmus present.     Left eye: Nystagmus present.     Conjunctiva/sclera: Conjunctivae normal.     Pupils: Pupils are equal, round, and reactive to light.  Cardiovascular:     Rate and Rhythm: Normal rate and regular rhythm.     Pulses: Normal pulses.     Heart sounds: Normal heart sounds.  Pulmonary:     Effort: Pulmonary effort is normal. No respiratory distress.     Breath sounds: Normal breath sounds.  Abdominal:     General: Abdomen is flat.     Palpations: Abdomen is soft.     Tenderness: There is no  abdominal tenderness.  Musculoskeletal:     Cervical back: No rigidity.     Right lower leg: No edema.     Left lower leg: No edema.  Lymphadenopathy:     Cervical: No cervical adenopathy.  Skin:    General: Skin is warm and dry.     Capillary Refill: Capillary refill takes less than 2 seconds.  Neurological:     Mental Status: He is alert and oriented to person, place, and time.     GCS: GCS eye subscore is 4. GCS verbal subscore is 5. GCS motor subscore is 6.     Cranial Nerves: Cranial nerves 2-12 are intact. No dysarthria or facial asymmetry.     Sensory: Sensation is intact.     Motor: Motor function is intact. No tremor.     Coordination: Coordination is intact.     Comments: Gait testing deferred secondary to patient safety Equal grip strength bilateral  Psychiatric:        Mood and Affect: Mood normal.        Behavior: Behavior normal.     ED Results and Treatments Labs (all labs ordered are listed, but only abnormal results are displayed) Labs Reviewed - No data to display                                                                                                                        Radiology No results found.  Pertinent labs & imaging results that were available during my care of the patient were reviewed by me and considered in my medical decision making (see MDM for details).  Medications Ordered in ED Medications - No data to  display                                                                                                                                   Procedures Procedures  (including critical care time)  Medical Decision Making / ED Course    Medical Decision Making:    LONALD LUMPKIN is a 57 y.o. male history anxiety here with complaint of dizziness. The complaint involves an extensive differential diagnosis and also carries with it a high risk of complications and morbidity.  Serious etiology was considered. Ddx includes but is not  limited to: Peripheral versus central vertigo, metabolic derangement, medication effect, Mnire's disease, disequilibrium, CVA, etc.  Complete initial physical exam performed, notably the patient  was resting on bed with eyes closed, opening his eyes moving his head does provoke dizziness sensation.    Reviewed and confirmed nursing documentation for past medical history, family history, social history.  Vital signs reviewed.        Additional history obtained: -Additional history obtained from family -External records from outside source obtained and reviewed including: Chart review including previous notes, labs, imaging, consultation notes including primary care documentation, prior labs and imaging, meds   Lab Tests: -I ordered, reviewed, and interpreted labs.   The pertinent results include:   Labs Reviewed - No data to display  Notable for ***  EKG   EKG Interpretation Date/Time:    Ventricular Rate:    PR Interval:    QRS Duration:    QT Interval:    QTC Calculation:   R Axis:      Text Interpretation:           Imaging Studies ordered: I ordered imaging studies including *** I independently visualized the following imaging with scope of interpretation limited to determining acute life threatening conditions related to emergency care; findings noted above, significant for *** I independently visualized and interpreted imaging. I agree with the radiologist interpretation   Medicines ordered and prescription drug management: No orders of the defined types were placed in this encounter.   -I have reviewed the patients home medicines and have made adjustments as needed   Consultations Obtained: I requested consultation with the ***,  and discussed lab and imaging findings as well as pertinent plan - they recommend: ***   Cardiac Monitoring: The patient was maintained on a cardiac monitor.  I personally viewed and interpreted the cardiac monitored which  showed an underlying rhythm of: NSR  Social Determinants of Health:  Diagnosis or treatment significantly limited by social determinants of health: former smoker   Reevaluation: After the interventions noted above, I reevaluated the patient and found that they have {resolved/improved/worsened:23923::"improved"}  Co morbidities that complicate the patient evaluation  Past Medical History:  Diagnosis Date   Anxiety    Erectile dysfunction       Dispostion: Disposition decision including need for hospitalization was considered, and patient {wsdispo:28070::"discharged from  emergency department."}    Final Clinical Impression(s) / ED Diagnoses Final diagnoses:  None     This chart was dictated using voice recognition software.  Despite best efforts to proofread,  errors can occur which can change the documentation meaning.

## 2023-02-21 NOTE — ED Notes (Signed)
Pt tx to MRI

## 2023-02-21 NOTE — Telephone Encounter (Signed)
Final outcome: Go to ED Now. Patient is currently at the ED.  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 He sees Dr. Pearletha Forge. His symptoms started 2 hours ago. GOTO Facility Not Listed ER Translation No Nurse Assessment Nurse: Shanna Cisco, RN, Gavin Pound Date/Time Lamount Cohen Time): 02/21/2023 11:43:39 AM Confirm and document reason for call. If symptomatic, describe symptoms. ---Caller states that he is feeling dizzy, weak, sweating and vomiting. Symptoms started 2 hrs ago. Does the patient have any new or worsening symptoms? ---Yes Will a triage be completed? ---Yes Related visit to physician within the last 2 weeks? ---No Does the PT have any chronic conditions? (i.e. diabetes, asthma, this includes High risk factors for pregnancy, etc.) ---No Is this a behavioral health or substance abuse call? ---No Guidelines Guideline Title Affirmed Question Affirmed Notes Nurse Date/Time Lamount Cohen Time) Dizziness - Vertigo SEVERE dizziness (vertigo) (e.g., unable to walk without assistance) Shanna Cisco, RN, Gavin Pound 02/21/2023 11:44:17 AM Disp. Time Lamount Cohen Time) Disposition Final User 02/21/2023 11:46:19 AM Go to ED Now (or PCP triage) Yes Shanna Cisco, RN, Deborah Final Disposition 02/21/2023 11:46:19 AM Go to ED Now (or PCP triage) Yes Shanna Cisco, RN, Jetty Duhamel Disagree/Comply Comply Caller Understands Yes PreDisposition Call Doctor Care Advice Given Per Guideline GO TO ED NOW (OR PCP TRIAGE): * IF NO PCP (PRIMARY CARE PROVIDER) SECOND-LEVEL TRIAGE: You need to be seen within the next hour. Go to the ED/UCC at _____________ Hospital. Leave as soon as you can. CARE ADVICE given per Dizziness - Vertigo (Adult) guideline. NOTE TO TRIAGER - DRIVING: * Another adult should drive. * Patient should not delay going to the emergency department. * If the patient cannot walk at all because of severe dizziness, then the patient may need to be transported via ambulance. NOTE TO TRIAGER - AMBULANCE TRANSPORT: * The patient or family members can arrange ambulance transport via private ambulance company or via EMS 911.

## 2023-02-21 NOTE — ED Notes (Signed)
Pt ambulated about 6ft. Pt states the room was still moving but not spinning, felt very off balance and could not walk too far.

## 2023-02-24 ENCOUNTER — Telehealth: Payer: Self-pay | Admitting: Family

## 2023-02-24 NOTE — Telephone Encounter (Signed)
Ok to transfer per Dr Jerline Pain

## 2023-02-24 NOTE — Telephone Encounter (Signed)
Lvm to inform pt

## 2023-02-24 NOTE — Telephone Encounter (Signed)
**   For all future visits . Patient also states spouse is current patient of Dr.Parker

## 2023-02-24 NOTE — Telephone Encounter (Signed)
Pt requesting transfer of care from [facility] to Dr.Parker . States he was recently seen at ER and would prefer to see an MD

## 2023-02-24 NOTE — Telephone Encounter (Signed)
Ok with me.   Dakota Miller. Jimmey Ralph, MD 02/24/2023 8:58 AM

## 2023-02-26 ENCOUNTER — Ambulatory Visit: Payer: BC Managed Care – PPO | Admitting: Family

## 2023-02-26 VITALS — BP 126/82 | HR 77 | Temp 98.0°F | Ht 76.0 in | Wt 199.0 lb

## 2023-02-26 DIAGNOSIS — R42 Dizziness and giddiness: Secondary | ICD-10-CM | POA: Diagnosis not present

## 2023-02-26 NOTE — Patient Instructions (Addendum)
It was very nice to see you today!   Continue taking the Meclizine to try and alleviate your dizziness, ok to cut pill in half if having too much drowsiness. Call [facility] at [phone] to schedule an appointment. I have sent a referral to our physical therapy office to try another maneuver that could help your symptoms. I have also sent a referral to ENT if you have continued symptoms to rule out Meniere's disease.      PLEASE NOTE:  If you had any lab tests please let us know if you have not heard back within a few days. You may see your results on MyChart before we have a chance to review them but we will give you a call once they are reviewed by [place]. If we ordered any referrals today, please let us know if you have not heard from their office within the next week.

## 2023-02-26 NOTE — Progress Notes (Signed)
Patient ID: Dakota Miller, male    DOB: [birth year], 57 y.o.   MRN: [id number]  Chief Complaint  Patient presents with   Follow-up    Pt was seen in ED on 7/12 for vertigo. Pt was given meclizine and f/u with neuro. Pt states dizziness is constant, vision is slightly blurred and lightheadedness. Has not been taking meclizine, only taken 1 yesterday and states it does not help.     HPI: Vertigo:  seen in ED on 7/12, treated with Epley maneuver and given Meclizine RX. MRI was negative for any anomaly. Reports having 2 bad episodes, and since describes more lightheadedness when he stands up, feels off balance, denies any tinnitus, headaches, or decreased hearing. Had nausea first day & not again. Pt reports distress over sx and not being able to work efficiently, has a lot of responsibility managing a store. Pt states he has had little improvement from Meclizine & they sent a Neurology referral but he did not hear anything from them.     Assessment & Plan:  Vertigo - pt very concerned about his balance problem & continued lightheadedness. Unable to visualize his TMs today, sending referral to ENT. Also advised pt of possible benefit with BPPV maneuvers via PT referral. Continue the Meclizine, can cut pill in half & take 2-3x/day. Neurology phone # provided to call & schedule (per EMR, they left pt a vm to return call).  -     Ambulatory referral to Physical Therapy -     Ambulatory referral to ENT   Subjective:    Outpatient Medications Prior to Visit  Medication Sig Dispense Refill   clobetasol ointment (TEMOVATE) 0.05 % 1 application Externally once a day, PM for 30 days     meclizine (ANTIVERT) 25 MG tablet Take 1 tablet (25 mg total) by mouth 3 (three) times daily as needed for dizziness. 30 tablet 0   Multiple Vitamins-Minerals (EMERGEN-C IMMUNE PO) Take by mouth.     No facility-administered medications prior to visit.   Past Medical History:  Diagnosis Date   Anxiety     Erectile dysfunction    No past surgical history on file. No Known Allergies    Objective:    Physical Exam Vitals and nursing note reviewed.  Constitutional:      General: He is not in acute distress.    Appearance: Normal appearance.  HENT:     Head: Normocephalic.     Right Ear: No decreased hearing noted. Swelling (mild edema in ear canal) present. There is no impacted cerumen. Tympanic membrane is scarred (only able to see hazy, whitish TM, unsure if actual TM).     Left Ear: No decreased hearing noted. There is no impacted cerumen.  Cardiovascular:     Rate and Rhythm: Normal rate and regular rhythm.  Pulmonary:     Effort: Pulmonary effort is normal.     Breath sounds: Normal breath sounds.  Musculoskeletal:        General: Normal range of motion.     Cervical back: Normal range of motion.  Skin:    General: Skin is warm and dry.  Neurological:     Mental Status: He is alert and oriented to person, place, and time.  Psychiatric:        Mood and Affect: Mood normal.    BP 126/82   Pulse 77   Temp 98 F (36.7 C) (Temporal)   Ht 6\' 4"  (1.93 m)   Wt 199 lb (90.3  kg)   SpO2 98%   BMI 24.22 kg/m  Wt Readings from Last 3 Encounters:  02/26/23 199 lb (90.3 kg)  05/02/22 210 lb (95.3 kg)  09/18/21 204 lb (92.5 kg)      Dulce Sellar, NP

## 2023-03-20 DIAGNOSIS — L409 Psoriasis, unspecified: Secondary | ICD-10-CM | POA: Diagnosis not present

## 2023-03-27 ENCOUNTER — Encounter (INDEPENDENT_AMBULATORY_CARE_PROVIDER_SITE_OTHER): Payer: Self-pay

## 2023-04-30 ENCOUNTER — Encounter: Payer: Self-pay | Admitting: Family Medicine

## 2023-04-30 ENCOUNTER — Ambulatory Visit: Payer: BC Managed Care – PPO | Admitting: Family Medicine

## 2023-04-30 VITALS — BP 121/77 | HR 88 | Temp 98.7°F | Ht 76.0 in | Wt 199.4 lb

## 2023-04-30 DIAGNOSIS — H8110 Benign paroxysmal vertigo, unspecified ear: Secondary | ICD-10-CM

## 2023-04-30 DIAGNOSIS — F411 Generalized anxiety disorder: Secondary | ICD-10-CM | POA: Diagnosis not present

## 2023-04-30 DIAGNOSIS — L409 Psoriasis, unspecified: Secondary | ICD-10-CM

## 2023-04-30 DIAGNOSIS — N529 Male erectile dysfunction, unspecified: Secondary | ICD-10-CM

## 2023-04-30 DIAGNOSIS — M79643 Pain in unspecified hand: Secondary | ICD-10-CM

## 2023-04-30 NOTE — Assessment & Plan Note (Signed)
Still has occasional feeling of off balance but no major flares since ED visit a couple of months ago.  We discussed treatment plan going forward.  Would be reasonable for him to see vestibular rehab at least once or twice to work on Epley maneuvers to prevent future recurrences and/or treat mild flares in the future.  Will place referral today.

## 2023-04-30 NOTE — Assessment & Plan Note (Signed)
Not currently on any medications.  He did try Viagra or Cialis many years ago but does not wish to try medications for the time being.  The flow have some improvement with this as we treat his anxiety disorder.  He will let us know if he changes mind about medications.

## 2023-04-30 NOTE — Patient Instructions (Signed)
It was very nice to see you today!  I will refer you for therapy and also for vestibular rehab.  Please continue to work on diet and exercise.  Return in about 3 months (around 07/30/2023) for Annual Physical.   Take care, Dr Jimmey Ralph  PLEASE NOTE:  If you had any lab tests, please let us know if you have not heard back within a few days. You may see your results on mychart before we have a chance to review them but we will give you a call once they are reviewed by [place].   If we ordered any referrals today, please let us know if you have not heard from their office within the next week.   If you had any urgent prescriptions sent in today, please check with the pharmacy within an hour of our visit to make sure the prescription was transmitted appropriately.   Please try these tips to maintain a healthy lifestyle:  Eat at least 3 REAL meals and 1-2 snacks per day.  Aim for no more than 5 hours between eating.  If you eat breakfast, please do so within one hour of getting up.   Each meal should contain half fruits/vegetables, one quarter protein, and one quarter carbs (no bigger than a computer mouse)  Cut down on sweet beverages. This includes juice, soda, and sweet tea.   Drink at least 1 glass of water with each meal and aim for at least 8 glasses per day  Exercise at least 150 minutes every week.

## 2023-04-30 NOTE — Assessment & Plan Note (Signed)
Recently diagnosed with psoriasis by his dermatologist.  He is currently on clobetasol.  This has improved his symptoms significantly.

## 2023-04-30 NOTE — Assessment & Plan Note (Signed)
Concern for psoriatic arthritis and he has been referred to rheumatology.  He will follow-up with them soon.

## 2023-04-30 NOTE — Assessment & Plan Note (Signed)
Patient does admit to being more anxious and stressed for the last few years.  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  He has been on BuSpar in the past but did not tolerate.  He would like to avoid medications at this point.  No SI or HI.  We will place referral for him to see a therapist.  He will let us know if he changes mind about medications.

## 2023-04-30 NOTE — Progress Notes (Signed)
Dakota Miller is a 57 y.o. male who presents today for an office visit.  Assessment/Plan:  Chronic Problems Addressed Today: BPPV (benign paroxysmal positional vertigo) Still has occasional feeling of off balance but no major flares since ED visit a couple of months ago.  We discussed treatment plan going forward.  Would be reasonable for him to see vestibular rehab at least once or twice to work on Epley maneuvers to prevent future recurrences and/or treat mild flares in the future.  Will place referral today.  Psoriasis Recently diagnosed with psoriasis by his dermatologist.  He is currently on clobetasol.  This has improved his symptoms significantly.  Generalized anxiety disorder Patient does admit to being more anxious and stressed for the last few years.  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  He has been on BuSpar in the past but did not tolerate.  He would like to avoid medications at this point.  No SI or HI.  We will place referral for him to see a therapist.  He will let us know if he changes mind about medications.  Hand pain Concern for psoriatic arthritis and he has been referred to rheumatology.  He will follow-up with them soon.  Erectile dysfunction Not currently on any medications.  He did try Viagra or Cialis many years ago but does not wish to try medications for the time being.  The flow have some improvement with this as we treat his anxiety disorder.  He will let us know if he changes mind about medications.  Preventative health care He will come back soon for CPE.  Up-to-date on colon cancer screening.    Subjective:  HPI:  See A/P for status of chronic conditions.  Patient is here to transfer care.  Overall feels like he is doing well today.  He did have a severe episode of vertigo a couple of months.  Started suddenly while at work.  Had a couple of hours of persistent spinning  sensation, dizziness, and vomiting.  End up going to the ED and had extensive workup there including labs and MRI.  MRI was negative.  Epley maneuver was performed in the ED with some improvement in symptoms.  He was started on meclizine and discharged home with neurology follow-up.  Symptoms have gradually improved since then.  He did see his previous PCP few days after this and was referred to vestibular rehab however he has not been able to follow-up with this.  He still feels a little off balance but has not had any major flareups since his ED visit.  ROS: Per HPI, otherwise a complete review of systems was negative.   PMH:  The following were reviewed and entered/updated in epic: Past Medical History:  Diagnosis Date   Anxiety    Erectile dysfunction    Patient Active Problem List   Diagnosis Date Noted   BPPV (benign paroxysmal positional vertigo) 04/30/2023   Hand pain 04/30/2023   Generalized anxiety disorder 06/12/2021   Psoriasis 06/12/2021   Chronic right-sided low back pain without sciatica 06/12/2021   Erectile dysfunction 06/02/2012   History reviewed. No pertinent surgical history.  Family History  Problem Relation Age of Onset   COPD Mother    Arthritis Mother    Hypertension Father    Hyperlipidemia Father    Early death Father    Heart disease Father    Stroke Father  Cancer Father        esophageal   Heart attack Father     Medications- reviewed and updated Current Outpatient Medications  Medication Sig Dispense Refill   clobetasol cream (TEMOVATE) 0.05 % Apply 1 Application topically every morning.     clobetasol ointment (TEMOVATE) 0.05 % 1 application Externally once a day, PM for 30 days     Multiple Vitamins-Minerals (EMERGEN-C IMMUNE PO) Take by mouth.     No current facility-administered medications for this visit.    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   Social Determinants of Health   Financial Resource Strain: Not on file  Food Insecurity: Not on file  Transportation Needs: Not on file  Physical Activity: Not on file  Stress: Not on file  Social Connections: Not on file          Objective:  Physical Exam: BP 121/77   Pulse 88   Temp 98.7 F (37.1 C) (Temporal)   Ht 6\' 4"  (1.93 m)   Wt 199 lb 6.4 oz (90.4 kg)   SpO2 99%   BMI 24.27 kg/m   Gen: No acute distress, resting comfortably CV: Regular rate and rhythm with no murmurs appreciated Pulm: Normal work of breathing, clear to auscultation bilaterally with no crackles, wheezes, or rhonchi Neuro: Grossly normal, moves all extremities Psych: Normal affect and thought content  Time Spent: 45 minutes of total time was spent on the date of the encounter performing the following actions: chart review prior to seeing the patient including recent Emergency Department visit and visits with previous PCP, obtaining history, performing a medically necessary exam, counseling on the treatment plan, placing orders, and documenting in our EHR.        Katina Degree. Jimmey Ralph, MD 04/30/2023 8:59 AM

## 2023-08-03 DIAGNOSIS — J029 Acute pharyngitis, unspecified: Secondary | ICD-10-CM | POA: Diagnosis not present

## 2023-08-26 ENCOUNTER — Encounter: Payer: Self-pay | Admitting: Family Medicine

## 2023-08-26 ENCOUNTER — Ambulatory Visit (INDEPENDENT_AMBULATORY_CARE_PROVIDER_SITE_OTHER): Payer: BC Managed Care – PPO | Admitting: Family Medicine

## 2023-08-26 VITALS — BP 136/88 | HR 85 | Temp 97.2°F | Ht 76.0 in | Wt 206.0 lb

## 2023-08-26 DIAGNOSIS — Z125 Encounter for screening for malignant neoplasm of prostate: Secondary | ICD-10-CM

## 2023-08-26 DIAGNOSIS — L409 Psoriasis, unspecified: Secondary | ICD-10-CM | POA: Diagnosis not present

## 2023-08-26 DIAGNOSIS — Z13220 Encounter for screening for lipoid disorders: Secondary | ICD-10-CM | POA: Diagnosis not present

## 2023-08-26 DIAGNOSIS — J310 Chronic rhinitis: Secondary | ICD-10-CM | POA: Diagnosis not present

## 2023-08-26 DIAGNOSIS — Z0001 Encounter for general adult medical examination with abnormal findings: Secondary | ICD-10-CM | POA: Diagnosis not present

## 2023-08-26 DIAGNOSIS — Z131 Encounter for screening for diabetes mellitus: Secondary | ICD-10-CM

## 2023-08-26 DIAGNOSIS — M79643 Pain in unspecified hand: Secondary | ICD-10-CM

## 2023-08-26 LAB — COMPREHENSIVE METABOLIC PANEL
ALT: 34 U/L (ref 0–53)
AST: 21 U/L (ref 0–37)
Albumin: 4.8 g/dL (ref 3.5–5.2)
Alkaline Phosphatase: 65 U/L (ref 39–117)
BUN: 17 mg/dL (ref 6–23)
CO2: 32 meq/L (ref 19–32)
Calcium: 9.7 mg/dL (ref 8.4–10.5)
Chloride: 101 meq/L (ref 96–112)
Creatinine, Ser: 0.92 mg/dL (ref 0.40–1.50)
GFR: 92.6 mL/min (ref >60.00)
Glucose, Bld: 113 mg/dL — ABNORMAL HIGH (ref 70–99)
Potassium: 5.5 meq/L — ABNORMAL HIGH (ref 3.5–5.1)
Sodium: 140 meq/L (ref 135–145)
Total Bilirubin: 0.7 mg/dL (ref 0.2–1.2)
Total Protein: 7.1 g/dL (ref 6.0–8.3)

## 2023-08-26 LAB — LIPID PANEL
Cholesterol: 189 mg/dL (ref 0–200)
HDL: 83.9 mg/dL (ref >39.00)
LDL Cholesterol: 95 mg/dL (ref 0–99)
NonHDL: 104.73
Total CHOL/HDL Ratio: 2
Triglycerides: 49 mg/dL (ref 0.0–149.0)
VLDL: 9.8 mg/dL (ref 0.0–40.0)

## 2023-08-26 LAB — CBC
HCT: 46.6 % (ref 39.0–52.0)
Hemoglobin: 15.5 g/dL (ref 13.0–17.0)
MCHC: 33.2 g/dL (ref 30.0–36.0)
MCV: 93.6 fL (ref 78.0–100.0)
Platelets: 292 10*3/uL (ref 150.0–400.0)
RBC: 4.98 Mil/uL (ref 4.22–5.81)
RDW: 14.1 % (ref 11.5–15.5)
WBC: 5.6 10*3/uL (ref 4.0–10.5)

## 2023-08-26 LAB — PSA: PSA: 1.76 ng/mL (ref 0.10–4.00)

## 2023-08-26 LAB — HEMOGLOBIN A1C: Hgb A1c MFr Bld: 6.2 % (ref 4.6–6.5)

## 2023-08-26 LAB — TSH: TSH: 0.82 u[IU]/mL (ref 0.35–5.50)

## 2023-08-26 MED ORDER — AZELASTINE HCL 0.1 % NA SOLN: 2.0000 | Freq: Two times a day (BID) | NASAL | 12 refills | Status: AC

## 2023-08-26 NOTE — Assessment & Plan Note (Signed)
 Uses clobetasol as needed.  Follows with dermatology.  Currently having mild outbreak on his hands but this is manageable when he uses clobetasol.

## 2023-08-26 NOTE — Progress Notes (Signed)
 Chief Complaint:  Dakota Miller is a 58 y.o. male who presents today for his annual comprehensive physical exam.    Assessment/Plan:  Chronic Problems Addressed Today: Rhinitis No red flags.  May be allergic rhinitis.  May also be related to cold dry air over the last several weeks.  This is very bothersome as he does have to deal with the public quite a bit.  We will try Astelin  nasal spray.  He will follow-up with us  in a few weeks.  May need referral to ENT if not improving.  Psoriasis Uses clobetasol as needed.  Follows with dermatology.  Currently having mild outbreak on his hands but this is manageable when he uses clobetasol.  Hand pain He has been referred to rheumatology but is not yet seen them.  Concern for psoriatic arthritis.  He will call to schedule along with rheumatology soon.  Preventative Healthcare: Check labs.  Tdap declined.  Due for cologuard later this year.  Patient Counseling(The following topics were reviewed and/or handout was given):  -Nutrition: Stressed importance of moderation in sodium/caffeine intake, saturated fat and cholesterol, caloric balance, sufficient intake of fresh fruits, vegetables, and fiber.  -Stressed the importance of regular exercise.   -Substance Abuse: Discussed cessation/primary prevention of tobacco, alcohol, or other drug use; driving or other dangerous activities under the influence; availability of treatment for abuse.   -Injury prevention: Discussed safety belts, safety helmets, smoke detector, smoking near bedding or upholstery.   -Sexuality: Discussed sexually transmitted diseases, partner selection, use of condoms, avoidance of unintended pregnancy and contraceptive alternatives.   -Dental health: Discussed importance of regular tooth brushing, flossing, and dental visits.  -Health maintenance and immunizations reviewed. Please refer to Health maintenance section.  Return to care in 1 year for next preventative visit.      Subjective:  HPI:  He has no acute complaints today.   Runny nose for a month. Tried allergy pills with some improvement. No cough. No sneeze. No headache. More when active. Has symptoms at work and at home.  No fevers or chills.  No recent illnesses.  Lifestyle Diet: Balanced. Plenty of fruits and vegetables.  Exercise: Very active with work. Does heavy lifiting at work.      08/26/2023    7:48 AM  Depression screen PHQ 2/9  Decreased Interest 0  Down, Depressed, Hopeless 0  PHQ - 2 Score 0    Health Maintenance Due  Topic Date Due   DTaP/Tdap/Td (1 - Tdap) Never done     ROS: Per HPI, otherwise a complete review of systems was negative.   PMH:  The following were reviewed and entered/updated in epic: Past Medical History:  Diagnosis Date   Anxiety    Erectile dysfunction    Patient Active Problem List   Diagnosis Date Noted   Rhinitis 08/26/2023   BPPV (benign paroxysmal positional vertigo) 04/30/2023   Hand pain 04/30/2023   Generalized anxiety disorder 06/12/2021   Psoriasis 06/12/2021   Chronic right-sided low back pain without sciatica 06/12/2021   Erectile dysfunction 06/02/2012   History reviewed. No pertinent surgical history.  Family History  Problem Relation Age of Onset   COPD Mother    Arthritis Mother    Hypertension Father    Hyperlipidemia Father    Early death Father    Heart disease Father    Stroke Father    Cancer Father        esophageal   Heart attack Father     Medications-  reviewed and updated Current Outpatient Medications  Medication Sig Dispense Refill   azelastine  (ASTELIN ) 0.1 % nasal spray Place 2 sprays into both nostrils 2 (two) times daily. 30 mL 12   clobetasol cream (TEMOVATE) 0.05 % Apply 1 Application topically every morning.     Multiple Vitamins-Minerals (EMERGEN-C IMMUNE PO) Take by mouth.     No current facility-administered medications for this visit.    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   Social Drivers of Corporate Investment Banker Strain: Not on file  Food Insecurity: Not on file  Transportation Needs: Not on file  Physical Activity: Not on file  Stress: Not on file  Social Connections: Not on file        Objective:  Physical Exam: BP 136/88   Pulse 85   Temp (!) 97.2 F (36.2 C) (Temporal)   Ht 6' 4 (1.93 m)   Wt 206 lb (93.4 kg)   SpO2 99%   BMI 25.08 kg/m   Body mass index is 25.08 kg/m. Wt Readings from Last 3 Encounters:  08/26/23 206 lb (93.4 kg)  04/30/23 199 lb 6.4 oz (90.4 kg)  02/26/23 199 lb (90.3 kg)   Gen: NAD, resting comfortably HEENT: TMs normal bilaterally. OP clear. No thyromegaly noted.  CV: RRR with no murmurs appreciated Pulm: NWOB, CTAB with no crackles, wheezes, or rhonchi GI: Normal bowel sounds present. Soft, Nontender, Nondistended. MSK: no edema, cyanosis, or clubbing noted Skin: warm, dry Neuro: CN2-12 grossly intact. Strength 5/5 in upper and lower extremities. Reflexes symmetric and intact bilaterally.  Psych: Normal affect and thought content     Rider Ermis M. Kennyth, MD 08/26/2023 8:19 AM

## 2023-08-26 NOTE — Assessment & Plan Note (Signed)
 No red flags.  May be allergic rhinitis.  May also be related to cold dry air over the last several weeks.  This is very bothersome as he does have to deal with the public quite a bit.  We will try Astelin  nasal spray.  He will follow-up with us  in a few weeks.  May need referral to ENT if not improving.

## 2023-08-26 NOTE — Assessment & Plan Note (Signed)
 He has been referred to rheumatology but is not yet seen them.  Concern for psoriatic arthritis.  He will call to schedule along with rheumatology soon.

## 2023-08-26 NOTE — Patient Instructions (Signed)
 It was very nice to see you today!  We will check blood work.    Please try the Astelin  for your runny nose.  Let me know in a few weeks how this is working for you.  Please continue to work on diet and exercise.  Return in about 3 months (around 11/25/2023) for Annual Physical.   Take care, Dr Kennyth  PLEASE NOTE:  If you had any lab tests, please let us  know if you have not heard back within a few days. You may see your results on mychart before we have a chance to review them but we will give you a call once they are reviewed by us .   If we ordered any referrals today, please let us  know if you have not heard from their office within the next week.   If you had any urgent prescriptions sent in today, please check with the pharmacy within an hour of our visit to make sure the prescription was transmitted appropriately.   Please try these tips to maintain a healthy lifestyle:  Eat at least 3 REAL meals and 1-2 snacks per day.  Aim for no more than 5 hours between eating.  If you eat breakfast, please do so within one hour of getting up.   Each meal should contain half fruits/vegetables, one quarter protein, and one quarter carbs (no bigger than a computer mouse)  Cut down on sweet beverages. This includes juice, soda, and sweet tea.   Drink at least 1 glass of water with each meal and aim for at least 8 glasses per day  Exercise at least 150 minutes every week.    Preventive Care 46-74 Years Old, Male Preventive care refers to lifestyle choices and visits with your health care provider that can promote health and wellness. Preventive care visits are also called wellness exams. What can I expect for my preventive care visit? Counseling During your preventive care visit, your health care provider may ask about your: Medical history, including: Past medical problems. Family medical history. Current health, including: Emotional well-being. Home life and relationship  well-being. Sexual activity. Lifestyle, including: Alcohol, nicotine or tobacco, and drug use. Access to firearms. Diet, exercise, and sleep habits. Safety issues such as seatbelt and bike helmet use. Sunscreen use. Work and work astronomer. Physical exam Your health care provider will check your: Height and weight. These may be used to calculate your BMI (body mass index). BMI is a measurement that tells if you are at a healthy weight. Waist circumference. This measures the distance around your waistline. This measurement also tells if you are at a healthy weight and may help predict your risk of certain diseases, such as type 2 diabetes and high blood pressure. Heart rate and blood pressure. Body temperature. Skin for abnormal spots. What immunizations do I need?  Vaccines are usually given at various ages, according to a schedule. Your health care provider will recommend vaccines for you based on your age, medical history, and lifestyle or other factors, such as travel or where you work. What tests do I need? Screening Your health care provider may recommend screening tests for certain conditions. This may include: Lipid and cholesterol levels. Diabetes screening. This is done by checking your blood sugar (glucose) after you have not eaten for a while (fasting). Hepatitis B test. Hepatitis C test. HIV (human immunodeficiency virus) test. STI (sexually transmitted infection) testing, if you are at risk. Lung cancer screening. Prostate cancer screening. Colorectal cancer screening. Talk with your  health care provider about your test results, treatment options, and if necessary, the need for more tests. Follow these instructions at home: Eating and drinking  Eat a diet that includes fresh fruits and vegetables, whole grains, lean protein, and low-fat dairy products. Take vitamin and mineral supplements as recommended by your health care provider. Do not drink alcohol if your  health care provider tells you not to drink. If you drink alcohol: Limit how much you have to 0-2 drinks a day. Know how much alcohol is in your drink. In the [place] one drink equals one 12 oz bottle of beer (355 mL), one 5 oz glass of wine (148 mL), or one 1 oz glass of hard liquor (44 mL). Lifestyle Brush your teeth every morning and night with fluoride toothpaste. Floss one time each day. Exercise for at least 30 minutes 5 or more days each week. Do not use any products that contain nicotine or tobacco. These products include cigarettes, chewing tobacco, and vaping devices, such as e-cigarettes. If you need help quitting, ask your health care provider. Do not use drugs. If you are sexually active, practice safe sex. Use a condom or other form of protection to prevent STIs. Take aspirin only as told by your health care provider. Make sure that you understand how much to take and what form to take. Work with your health care provider to find out whether it is safe and beneficial for you to take aspirin daily. Find healthy ways to manage stress, such as: Meditation, yoga, or listening to music. Journaling. Talking to a trusted person. Spending time with friends and family. Minimize exposure to UV radiation to reduce your risk of skin cancer. Safety Always wear your seat belt while driving or riding in a vehicle. Do not drive: If you have been drinking alcohol. Do not ride with someone who has been drinking. When you are tired or distracted. While texting. If you have been using any mind-altering substances or drugs. Wear a helmet and other protective equipment during sports activities. If you have firearms in your house, make sure you follow all gun safety procedures. What's next? Go to your health care provider once a year for an annual wellness visit. Ask your health care provider how often you should have your eyes and teeth checked. Stay up to date on all vaccines. This information  is not intended to replace advice given to you by your health care provider. Make sure you discuss any questions you have with your health care provider. Document Revised: 01/24/2021 Document Reviewed: 01/24/2021 Elsevier Patient Education  2024 [employer].

## 2023-08-28 NOTE — Progress Notes (Signed)
 His A1c is borderline elevated.  Not in the diabetic range.  Do not need to start meds but he should work on diet and exercise and we can recheck in a year or so.  Potassium is a little bit elevated.  I think this is probably a lab error.  We can recheck next time he comes in for an office visit or he can go to [facility] lab to have this checked sooner if he wishes.  The rest of his labs are all at goal and we can recheck everything else in a year.

## 2023-11-26 DIAGNOSIS — F4322 Adjustment disorder with anxiety: Secondary | ICD-10-CM | POA: Diagnosis not present

## 2023-12-03 DIAGNOSIS — F4322 Adjustment disorder with anxiety: Secondary | ICD-10-CM | POA: Diagnosis not present

## 2023-12-10 DIAGNOSIS — F4322 Adjustment disorder with anxiety: Secondary | ICD-10-CM | POA: Diagnosis not present

## 2023-12-17 DIAGNOSIS — F4322 Adjustment disorder with anxiety: Secondary | ICD-10-CM | POA: Diagnosis not present

## 2023-12-24 DIAGNOSIS — F4322 Adjustment disorder with anxiety: Secondary | ICD-10-CM | POA: Diagnosis not present

## 2023-12-31 DIAGNOSIS — F4322 Adjustment disorder with anxiety: Secondary | ICD-10-CM | POA: Diagnosis not present

## 2024-01-07 DIAGNOSIS — F4322 Adjustment disorder with anxiety: Secondary | ICD-10-CM | POA: Diagnosis not present

## 2024-02-02 DIAGNOSIS — F4322 Adjustment disorder with anxiety: Secondary | ICD-10-CM | POA: Diagnosis not present

## 2024-02-11 DIAGNOSIS — F4322 Adjustment disorder with anxiety: Secondary | ICD-10-CM | POA: Diagnosis not present

## 2024-02-18 DIAGNOSIS — F4322 Adjustment disorder with anxiety: Secondary | ICD-10-CM | POA: Diagnosis not present

## 2024-02-25 DIAGNOSIS — F4322 Adjustment disorder with anxiety: Secondary | ICD-10-CM | POA: Diagnosis not present

## 2024-03-03 DIAGNOSIS — F4322 Adjustment disorder with anxiety: Secondary | ICD-10-CM | POA: Diagnosis not present

## 2024-03-03 DIAGNOSIS — H16042 Marginal corneal ulcer, left eye: Secondary | ICD-10-CM | POA: Diagnosis not present

## 2024-03-10 DIAGNOSIS — F4322 Adjustment disorder with anxiety: Secondary | ICD-10-CM | POA: Diagnosis not present

## 2024-03-17 DIAGNOSIS — F4322 Adjustment disorder with anxiety: Secondary | ICD-10-CM | POA: Diagnosis not present

## 2024-03-31 DIAGNOSIS — F4322 Adjustment disorder with anxiety: Secondary | ICD-10-CM | POA: Diagnosis not present

## 2024-04-07 DIAGNOSIS — F4322 Adjustment disorder with anxiety: Secondary | ICD-10-CM | POA: Diagnosis not present

## 2024-04-14 DIAGNOSIS — F4322 Adjustment disorder with anxiety: Secondary | ICD-10-CM | POA: Diagnosis not present

## 2024-04-16 DIAGNOSIS — L409 Psoriasis, unspecified: Secondary | ICD-10-CM | POA: Diagnosis not present

## 2024-04-16 DIAGNOSIS — E049 Nontoxic goiter, unspecified: Secondary | ICD-10-CM | POA: Diagnosis not present

## 2024-04-16 DIAGNOSIS — M7061 Trochanteric bursitis, right hip: Secondary | ICD-10-CM | POA: Diagnosis not present

## 2024-04-16 DIAGNOSIS — M7062 Trochanteric bursitis, left hip: Secondary | ICD-10-CM | POA: Diagnosis not present

## 2024-04-16 DIAGNOSIS — M25519 Pain in unspecified shoulder: Secondary | ICD-10-CM | POA: Diagnosis not present

## 2024-04-21 DIAGNOSIS — F4322 Adjustment disorder with anxiety: Secondary | ICD-10-CM | POA: Diagnosis not present

## 2024-04-22 LAB — LAB REPORT - SCANNED
EGFR: 96
TSH: 0.533

## 2024-04-28 DIAGNOSIS — F4322 Adjustment disorder with anxiety: Secondary | ICD-10-CM | POA: Diagnosis not present

## 2024-05-12 DIAGNOSIS — F4322 Adjustment disorder with anxiety: Secondary | ICD-10-CM | POA: Diagnosis not present

## 2024-05-19 DIAGNOSIS — F4322 Adjustment disorder with anxiety: Secondary | ICD-10-CM | POA: Diagnosis not present

## 2024-05-26 DIAGNOSIS — F4322 Adjustment disorder with anxiety: Secondary | ICD-10-CM | POA: Diagnosis not present

## 2024-06-02 DIAGNOSIS — F4322 Adjustment disorder with anxiety: Secondary | ICD-10-CM | POA: Diagnosis not present

## 2024-06-09 DIAGNOSIS — F4322 Adjustment disorder with anxiety: Secondary | ICD-10-CM | POA: Diagnosis not present

## 2024-06-16 DIAGNOSIS — F4322 Adjustment disorder with anxiety: Secondary | ICD-10-CM | POA: Diagnosis not present

## 2024-06-23 DIAGNOSIS — F4322 Adjustment disorder with anxiety: Secondary | ICD-10-CM | POA: Diagnosis not present

## 2024-06-30 DIAGNOSIS — F4322 Adjustment disorder with anxiety: Secondary | ICD-10-CM | POA: Diagnosis not present

## 2024-07-14 DIAGNOSIS — F4322 Adjustment disorder with anxiety: Secondary | ICD-10-CM | POA: Diagnosis not present

## 2024-07-21 DIAGNOSIS — F4322 Adjustment disorder with anxiety: Secondary | ICD-10-CM | POA: Diagnosis not present

## 2024-08-30 ENCOUNTER — Encounter: Payer: BC Managed Care – PPO | Admitting: Family Medicine
